# Patient Record
Sex: Female | Born: 1970 | Race: White | Hispanic: No | Marital: Married | Smoking: Former smoker
Health system: Southern US, Community
[De-identification: ages and names within clinical notes are randomized; demographics above are authoritative.]

## PROBLEM LIST (undated history)

## (undated) DIAGNOSIS — K2100 Gastro-esophageal reflux disease with esophagitis, without bleeding: Secondary | ICD-10-CM

## (undated) DIAGNOSIS — Z87448 Personal history of other diseases of urinary system: Secondary | ICD-10-CM

## (undated) DIAGNOSIS — G43909 Migraine, unspecified, not intractable, without status migrainosus: Secondary | ICD-10-CM

## (undated) DIAGNOSIS — L4050 Arthropathic psoriasis, unspecified: Secondary | ICD-10-CM

## (undated) DIAGNOSIS — K449 Diaphragmatic hernia without obstruction or gangrene: Secondary | ICD-10-CM

## (undated) DIAGNOSIS — Z90710 Acquired absence of both cervix and uterus: Secondary | ICD-10-CM

## (undated) DIAGNOSIS — K219 Gastro-esophageal reflux disease without esophagitis: Secondary | ICD-10-CM

## (undated) DIAGNOSIS — M069 Rheumatoid arthritis, unspecified: Secondary | ICD-10-CM

## (undated) HISTORY — DX: Diaphragmatic hernia without obstruction or gangrene: K44.9

## (undated) HISTORY — PX: DILATION AND CURETTAGE OF UTERUS: SHX78

## (undated) HISTORY — DX: Personal history of other diseases of urinary system: Z87.448

## (undated) HISTORY — DX: Migraine, unspecified, not intractable, without status migrainosus: G43.909

## (undated) HISTORY — PX: ERCP: SHX60

## (undated) HISTORY — DX: Arthropathic psoriasis, unspecified: L40.50

## (undated) HISTORY — DX: Acquired absence of both cervix and uterus: Z90.710

## (undated) HISTORY — DX: Gastro-esophageal reflux disease without esophagitis: K21.9

## (undated) HISTORY — DX: Rheumatoid arthritis, unspecified: M06.9

## (undated) HISTORY — DX: Gastro-esophageal reflux disease with esophagitis, without bleeding: K21.00

## (undated) HISTORY — PX: WRIST SURGERY: SHX841

## (undated) HISTORY — PX: TOTAL ABDOMINAL HYSTERECTOMY: SHX209

## (undated) HISTORY — PX: CHOLECYSTECTOMY: SHX55

---

## 2009-11-19 ENCOUNTER — Ambulatory Visit: Payer: Self-pay | Admitting: Family Medicine

## 2016-02-15 ENCOUNTER — Ambulatory Visit (INDEPENDENT_AMBULATORY_CARE_PROVIDER_SITE_OTHER): Payer: 59 | Admitting: Osteopathic Medicine

## 2016-02-15 ENCOUNTER — Encounter: Payer: Self-pay | Admitting: Osteopathic Medicine

## 2016-02-15 VITALS — BP 138/88 | HR 100 | Ht 65.0 in | Wt 188.0 lb

## 2016-02-15 DIAGNOSIS — Z23 Encounter for immunization: Secondary | ICD-10-CM | POA: Insufficient documentation

## 2016-02-15 DIAGNOSIS — E781 Pure hyperglyceridemia: Secondary | ICD-10-CM | POA: Diagnosis not present

## 2016-02-15 DIAGNOSIS — J069 Acute upper respiratory infection, unspecified: Secondary | ICD-10-CM | POA: Diagnosis not present

## 2016-02-15 NOTE — Progress Notes (Signed)
HPI: Summer Myers is a 45 y.o. female who presents to [place] today for chief complaint of:  Chief Complaint  Patient presents with  . Establish Care    need vaccines updated for school.     VARICELLA NEEDED - Was told borderline titers for Varicella for school and needs booster and repeat titers. She doesn't have immunization records with her today.  HX FALL AND WRIST INJURY TO R WRIST - ? Arthritis, (+) ganglion cyst, will consider surgical referral, as previously evaluated by a hand specialist in [place] - patient has been fighting a cold for about a week, slowly getting better, sinus congestion and postnasal drip, controlled with over-the-counter medicines.    Past medical, social and family history reviewed: History reviewed. No pertinent past medical history. Past Surgical History  Procedure Laterality Date  . Dilation and curettage of uterus    . Ercp    . Cholecystectomy     Social History  Substance Use Topics  . Smoking status: Former Research scientist (life sciences)  . Smokeless tobacco: Not on file  . Alcohol Use: Not on file   Family History  Problem Relation Age of Onset  . Diabetes Mother   . Hypertension Mother   . Hypertension Maternal Grandmother   . Alcohol abuse Maternal Grandfather   . Diabetes Paternal Grandmother   . Hypertension Paternal Grandmother   . Cancer Paternal Grandfather   . Hypertension Paternal Grandfather     No current outpatient prescriptions on file.   No current facility-administered medications for this visit.   Allergies  Allergen Reactions  . Amoxicillin     Chest pain and chest tightness  . Cephalexin Rash  . Tetracyclines & Related Rash      Review of Systems: CONSTITUTIONAL:  No  fever, no chills, No  unintentional weight changes HEAD/EYES/EARS/NOSE/THROAT: No  headache, no vision change, no hearing change, No  sore throat, No  sinus pressure CARDIAC: No  chest pain, No  pressure,  No palpitations, No  orthopnea RESPIRATORY: No  cough, No  shortness of breath/wheeze GASTROINTESTINAL: No  nausea, No  vomiting, No  abdominal pain, No  blood in stool, No  diarrhea, No  constipation  MUSCULOSKELETAL: (+) Right wrist myalgia/arthralgia GENITOURINARY: No  incontinence, No  abnormal genital bleeding/discharge SKIN: No  rash/wounds/concerning lesions HEM/ONC: No  easy bruising/bleeding, No  abnormal lymph node ENDOCRINE: No polyuria/polydipsia/polyphagia, No  heat/cold intolerance  NEUROLOGIC: No  weakness, No  dizziness, No  slurred speech PSYCHIATRIC: No  concerns with depression, No  concerns with anxiety, No sleep problems  Exam:  BP 138/88 mmHg  Pulse 100  Ht 5' 5"  (1.651 m)  Wt 188 lb (85.276 kg)  BMI 31.28 kg/m2 Constitutional: VS see above. General Appearance: alert, well-developed, well-nourished, NAD Eyes: Normal lids and conjunctive, non-icteric sclera, PERRLA Ears, Nose, Mouth, Throat: MMM, Normal external inspection ears/nares/mouth/lips/gums, TM normal bilaterally. Pharynx no erythema, no exudate.  Neck: No masses, trachea midline. No thyroid enlargement/tenderness/mass appreciated. No lymphadenopathy Respiratory: Normal respiratory effort. no wheeze, no rhonchi, no rales Cardiovascular: S1/S2 normal, no murmur, no rub/gallop auscultated. RRR.  Gastrointestinal: Nontender, no masses. No hepatomegaly, no splenomegaly.  Musculoskeletal: Gait normal. No clubbing/cyanosis of digits.  Psychiatric: Normal judgment/insight. Normal mood and affect. Oriented x3.    No results found for this or any previous visit (from the past 72 hour(s)).    ASSESSMENT/PLAN:  Need for varicella vaccine - Plan: Varicella zoster antibody, IgG plan to come back when  she is feeling a bit better for varicella vaccine, patient started to call head so she can be put on the nurse schedule for this. She also needs an order for repeat titer testing, this was provided to her along with  repeat lipid panel for hypertriglyceridemia. Await immunization records  Hypertriglyceridemia - Plan: Lipid panel  Viral URI - call a sinus pressure is still no better in the next 5-7 days and can prescribe antibiotic. Otherwise continue supportive care.   Return in about 1 week (around 02/22/2016) for varicella vaccination - nurse visit.

## 2016-02-15 NOTE — Patient Instructions (Signed)
Return as needed and for annual wellness exam.   Please call ahead prior to coming in for your Varicella vaccination so we can put you on the nurse schedule. If you're able, please bring a copy of your immunization records/titers so we can have these on file.   Thanks for coming in today! Take care! -Dr. Loni Muse.

## 2016-02-26 ENCOUNTER — Telehealth: Payer: Self-pay

## 2016-02-26 DIAGNOSIS — K449 Diaphragmatic hernia without obstruction or gangrene: Secondary | ICD-10-CM

## 2016-02-26 NOTE — Telephone Encounter (Signed)
Patient called stated that she has a history of hiatal hernia and she has been seeing a GI specialist here in [place] . She stated that [employer] now requires a referral so she is requesting a referral to a GI here in [place]. Fax [phone]. Chelsea Nusz,CMA

## 2016-02-27 ENCOUNTER — Encounter: Payer: Self-pay | Admitting: Gastroenterology

## 2016-03-14 ENCOUNTER — Telehealth: Payer: Self-pay

## 2016-03-14 ENCOUNTER — Other Ambulatory Visit: Payer: Self-pay | Admitting: Osteopathic Medicine

## 2016-03-14 MED ORDER — ACYCLOVIR 400 MG PO TABS: 800.0000 mg | ORAL_TABLET | Freq: Three times a day (TID) | ORAL | Status: DC

## 2016-03-14 NOTE — Telephone Encounter (Signed)
Patient stated that she only has cold sores every once and a while just around her mouth but no genital outbreaks. Patient is advised that medication has been sent to her pharmacy. Luretta Everly,CMA

## 2016-03-14 NOTE — Telephone Encounter (Signed)
We do not have this medication on file for her. She needs to tell us if she uses the acyclovir for outbreaks as needed or if she uses it every day for suppressive therapy, also if she has genital herpes or just oral cold sores. I need this information so I can order the appropriate dose. Please also ask if she has ever used Valtrex/valacyclovir as this requires less frequent dosing and is typically easier to take. She needs to provide [place] with a list of all prescription and over-the-counter medications that she takes routinely

## 2016-03-14 NOTE — Telephone Encounter (Signed)
Since I'll be out of the office, I went ahead and sent Rx for as-needed cold sore treatment, still need the other info as noted earlier, thanks

## 2016-03-14 NOTE — Telephone Encounter (Signed)
Patient called request a Rx for Acylovir PO. She stated that she gets cold sores and need a rx for this medication. Please advise if patient needs office visit. Rhonda Cunningham,CMA

## 2016-04-16 ENCOUNTER — Ambulatory Visit: Payer: 59 | Admitting: Osteopathic Medicine

## 2016-04-17 ENCOUNTER — Ambulatory Visit: Payer: 59 | Admitting: Osteopathic Medicine

## 2016-04-21 ENCOUNTER — Telehealth: Payer: Self-pay

## 2016-04-21 ENCOUNTER — Ambulatory Visit: Payer: 59 | Admitting: Osteopathic Medicine

## 2016-04-21 NOTE — Telephone Encounter (Addendum)
Patient called and cancelled her appointment for today @ 9:45 for lump in back of her throat. She stated that it hurts when she swallow.. She request a referral for ENT specialist rather than come in for and office visit and waste co pay. Please advise. Jeramy Dimmick,CMA

## 2016-04-21 NOTE — Telephone Encounter (Signed)
Spoke to patient gave her instructions as noted below. Patient stated that she will call her insurance company to see and that if she decided to come in for an office visit she will call the office for an appointment. Nyonna Hargrove,CMA

## 2016-04-21 NOTE — Telephone Encounter (Signed)
I am not sure that a referral will be accepted without an office visit to primary care for further assessment, she should call her insurance company to check up on this. Since there are other things that can cause her symptoms, I cannot diagnose this over the phone but if she would like a referral I'm happy to place one but she needs to let us know if insurance will allow this.

## 2016-04-25 ENCOUNTER — Ambulatory Visit: Payer: 59 | Admitting: Gastroenterology

## 2016-06-13 ENCOUNTER — Ambulatory Visit: Payer: 59 | Admitting: Osteopathic Medicine

## 2016-07-08 ENCOUNTER — Ambulatory Visit: Payer: 59 | Admitting: Osteopathic Medicine

## 2016-07-25 ENCOUNTER — Encounter: Payer: Self-pay | Admitting: Osteopathic Medicine

## 2016-07-25 DIAGNOSIS — R1310 Dysphagia, unspecified: Secondary | ICD-10-CM | POA: Insufficient documentation

## 2016-10-10 DIAGNOSIS — K645 Perianal venous thrombosis: Secondary | ICD-10-CM | POA: Insufficient documentation

## 2016-10-21 DIAGNOSIS — M67439 Ganglion, unspecified wrist: Secondary | ICD-10-CM | POA: Insufficient documentation

## 2017-02-23 ENCOUNTER — Encounter: Payer: Self-pay | Admitting: Osteopathic Medicine

## 2017-02-23 DIAGNOSIS — K649 Unspecified hemorrhoids: Secondary | ICD-10-CM | POA: Insufficient documentation

## 2017-05-05 ENCOUNTER — Other Ambulatory Visit: Payer: Self-pay | Admitting: Osteopathic Medicine

## 2018-04-28 DIAGNOSIS — K449 Diaphragmatic hernia without obstruction or gangrene: Secondary | ICD-10-CM | POA: Insufficient documentation

## 2018-04-28 DIAGNOSIS — I10 Essential (primary) hypertension: Secondary | ICD-10-CM | POA: Insufficient documentation

## 2018-04-28 DIAGNOSIS — K219 Gastro-esophageal reflux disease without esophagitis: Secondary | ICD-10-CM | POA: Insufficient documentation

## 2018-05-03 LAB — HM PAP SMEAR: HM Pap smear: NORMAL

## 2018-11-22 ENCOUNTER — Telehealth: Payer: Self-pay | Admitting: Osteopathic Medicine

## 2018-11-22 NOTE — Telephone Encounter (Signed)
Patient's last visit was 01/2016  Our records indicate that patient is not up to date on annual physical or Pap / Mammogram.   Can we call her to set up an annual wellness physical sometime in the spring, unless she has established with another practice and if so can remove me as PCP?

## 2018-11-22 NOTE — Telephone Encounter (Signed)
Left VM for Pt to return clinic call to schedule.

## 2020-04-06 ENCOUNTER — Encounter: Payer: Self-pay | Admitting: Medical-Surgical

## 2020-04-06 ENCOUNTER — Ambulatory Visit (INDEPENDENT_AMBULATORY_CARE_PROVIDER_SITE_OTHER): Payer: No Typology Code available for payment source | Admitting: Medical-Surgical

## 2020-04-06 ENCOUNTER — Other Ambulatory Visit: Payer: Self-pay

## 2020-04-06 VITALS — BP 133/87 | HR 90 | Ht 64.0 in | Wt 194.0 lb

## 2020-04-06 DIAGNOSIS — R635 Abnormal weight gain: Secondary | ICD-10-CM

## 2020-04-06 DIAGNOSIS — Z0000 Encounter for general adult medical examination without abnormal findings: Secondary | ICD-10-CM | POA: Diagnosis not present

## 2020-04-06 DIAGNOSIS — M2550 Pain in unspecified joint: Secondary | ICD-10-CM

## 2020-04-06 DIAGNOSIS — K449 Diaphragmatic hernia without obstruction or gangrene: Secondary | ICD-10-CM | POA: Diagnosis not present

## 2020-04-06 DIAGNOSIS — G8929 Other chronic pain: Secondary | ICD-10-CM

## 2020-04-06 DIAGNOSIS — Z1211 Encounter for screening for malignant neoplasm of colon: Secondary | ICD-10-CM

## 2020-04-06 MED ORDER — OMEPRAZOLE 40 MG PO CPDR: 40.0000 mg | DELAYED_RELEASE_CAPSULE | Freq: Every day | ORAL | 3 refills | Status: DC

## 2020-04-06 MED ORDER — SAXENDA 18 MG/3ML ~~LOC~~ SOPN: PEN_INJECTOR | SUBCUTANEOUS | 1 refills | Status: DC

## 2020-04-06 MED ORDER — ACYCLOVIR 400 MG PO TABS: ORAL_TABLET | ORAL | 3 refills | Status: DC

## 2020-04-06 NOTE — Progress Notes (Signed)
New Patient Office Visit  Subjective:  Patient ID: Summer Myers, female    DOB: 02-18-1971  Age: 49 y.o. MRN: [id number]  CC:  Chief Complaint  Patient presents with  . Establish Care  . Referral    rheumatology  . Hiatal Hernia    HPI Summer Myers presents to establish care.  Hiatal hernia-history of hiatal hernia and recurrent gastritis.  Has been taking omeprazole 40 mg daily with good control of symptoms.  She stopped taking this medication for a couple of days her symptoms recur.  Currently getting her medication over-the-counter but this is very costly and she would like a prescription today.  Weight gain-has been struggling with weight gain for the last couple of years.  Maintaining around 190 pounds despite regular frequent exercise and dietary modifications.  Previously tried phentermine with weight loss to around 150 pounds.  Regained her weight when her husband was diagnosed with cancer requiring them to relocate for his treatment for 4 months.  Family history of thyroid disease.  Is interested in starting weight loss medications today.  Chronic joint pain-previously tested for rheumatoid arthritis, negative.  Has had chronic joint pain in multiple joints since she was in her 54s.  Family history of rheumatoid arthritis with negative RF.  Requesting referral to rheumatology for further investigation and work-up.  Due for colon cancer screening.  Reports her grandfather died at an early age from colon cancer and that she is overdue.  Would like to do Cologuard.  Due for mammogram and Pap smear.  Will seek her well woman care with gynecology.  Due for Tdap, does not want to update today.  Past Medical History:  Diagnosis Date  . GERD (gastroesophageal reflux disease)   . Rheumatoid arthritis [facility])     Past Surgical History:  Procedure Laterality Date  . CHOLECYSTECTOMY    . DILATION AND CURETTAGE OF UTERUS    . ERCP      Family History  Problem Relation Age  of Onset  . Diabetes Mother   . Hypertension Mother   . Hypertension Maternal Grandmother   . Alcohol abuse Maternal Grandfather   . Diabetes Paternal Grandmother   . Hypertension Paternal Grandmother   . Cancer Paternal Grandfather   . Hypertension Paternal Grandfather     Social History   Socioeconomic History  . Marital status: Married    Spouse name: Not on file  . Number of children: Not on file  . Years of education: Not on file  . Highest education level: Not on file  Occupational History  . Not on file  Tobacco Use  . Smoking status: Former Smoker    Packs/day: 1.00    Types: Cigarettes    Quit date: 04/06/1998    Years since quitting: 22.0  . Smokeless tobacco: Never Used  Substance and Sexual Activity  . Alcohol use: Not Currently    Alcohol/week: 0.0 standard drinks  . Drug use: Never  . Sexual activity: Yes    Partners: Male    Birth control/protection: None  Other Topics Concern  . Not on file  Social History Narrative  . Not on file   Social Determinants of Health   Financial Resource Strain:   . Difficulty of Paying Living Expenses:   Food Insecurity:   . Worried About Charity fundraiser in the Last Year:   . Arboriculturist in the Last Year:   Transportation Needs:   . Film/video editor (Medical):   Marland Kitchen  Lack of Transportation (Non-Medical):   Physical Activity:   . Days of Exercise per Week:   . Minutes of Exercise per Session:   Stress:   . Feeling of Stress :   Social Connections:   . Frequency of Communication with Friends and Family:   . Frequency of Social Gatherings with Friends and Family:   . Attends Religious Services:   . Active Member of Clubs or Organizations:   . Attends Archivist Meetings:   Marland Kitchen Marital Status:   Intimate Partner Violence: Not At Risk  . Fear of Current or Ex-Partner: No  . Emotionally Abused: No  . Physically Abused: No  . Sexually Abused: No    ROS Review of Systems  Constitutional:  Negative for chills, fatigue, fever and unexpected weight change.  HENT: Negative for congestion, rhinorrhea, sinus pressure and sore throat.   Respiratory: Negative for cough, chest tightness and shortness of breath.   Cardiovascular: Negative for chest pain, palpitations and leg swelling.  Gastrointestinal: Negative for abdominal pain, blood in stool, constipation, diarrhea, nausea and vomiting.  Endocrine: Positive for cold intolerance. Negative for heat intolerance.  Genitourinary: Negative for dysuria, frequency, urgency, vaginal bleeding and vaginal discharge.  Skin:       Hair thinning  Neurological: Negative for dizziness, light-headedness and headaches.  Psychiatric/Behavioral: Negative for self-injury, sleep disturbance and suicidal ideas. The patient is not nervous/anxious.     Objective:   Today's Vitals: BP 133/87   Pulse 90   Ht 5' 4"  (1.626 m)   Wt 194 lb (88 kg)   LMP 03/30/2020 (Approximate)   BMI 33.30 kg/m   Physical Exam Vitals reviewed.  Constitutional:      General: She is not in acute distress.    Appearance: Normal appearance.  HENT:     Head: Normocephalic and atraumatic.  Cardiovascular:     Rate and Rhythm: Normal rate and regular rhythm.     Pulses: Normal pulses.     Heart sounds: Normal heart sounds. No murmur. No friction rub. No gallop.   Pulmonary:     Effort: Pulmonary effort is normal. No respiratory distress.     Breath sounds: Normal breath sounds. No wheezing.  Skin:    General: Skin is warm and dry.  Neurological:     Mental Status: She is alert and oriented to person, place, and time.  Psychiatric:        Mood and Affect: Mood normal.        Behavior: Behavior normal.        Thought Content: Thought content normal.        Judgment: Judgment normal.     Assessment & Plan:   1. Hiatal hernia Continue omeprazole 40 mg daily, refill provided.  Given recurrent esophagitis, this will be a maintenance medication for her. -  omeprazole (PRILOSEC) 40 MG capsule; Take 1 capsule (40 mg total) by mouth daily.  Dispense: 90 capsule; Refill: 3  2. Chronic joint pain Refer to rheumatology per patient request. - Ambulatory referral to Rheumatology  3. Weight gain Discussed weight loss options.  Patient very interested in [place].  Sending in [place] prescription in hopes that insurance will cover it.  If this does not go through, we will start phentermine.  Checking TSH. - Liraglutide -Weight Management (SAXENDA) 18 MG/3ML SOPN; Inject 0.1 mLs (0.6 mg total) into the skin once a week for 7 days, THEN 0.2 mLs (1.2 mg total) once a week for 7 days, THEN 0.3 mLs (  1.8 mg total) once a week for 7 days, THEN 0.4 mLs (2.4 mg total) once a week for 7 days, THEN 0.5 mLs (3 mg total) once a week.  Dispense: 7.5 mL; Refill: 1 - TSH  4. Preventative health care Last preventative lab work approximately 1.5 years ago.  Checking CBC, CMP, and lipid panel today. - CBC - COMPLETE METABOLIC PANEL WITH GFR - Lipid panel  5. Colon cancer screening Cologuard ordered. - Cologuard   Outpatient Encounter Medications as of 04/06/2020  Medication Sig  . acyclovir (ZOVIRAX) 400 MG tablet TAKE TWO TABLETS BY MOUTH THREE TIMES DAILY FOR 2-5 DAYS WITH COLD SORE RECURRENCE  . [DISCONTINUED] acyclovir (ZOVIRAX) 400 MG tablet TAKE TWO TABLETS BY MOUTH THREE TIMES DAILY FOR 2-5 DAYS WITH COLD SORE RECURRENCE  . Liraglutide -Weight Management (SAXENDA) 18 MG/3ML SOPN Inject 0.1 mLs (0.6 mg total) into the skin once a week for 7 days, THEN 0.2 mLs (1.2 mg total) once a week for 7 days, THEN 0.3 mLs (1.8 mg total) once a week for 7 days, THEN 0.4 mLs (2.4 mg total) once a week for 7 days, THEN 0.5 mLs (3 mg total) once a week.  Marland Kitchen omeprazole (PRILOSEC) 40 MG capsule Take 1 capsule (40 mg total) by mouth daily.  . [DISCONTINUED] omeprazole (PRILOSEC) 20 MG capsule Take 40 mg by mouth daily.    No facility-administered encounter medications on file as of  04/06/2020.    Follow-up: Return in about 3 months (around 07/06/2020) for weight management follow up.   Clearnce Sorrel, DNP, APRN, FNP-BC [facility]

## 2020-04-09 ENCOUNTER — Other Ambulatory Visit: Payer: Self-pay | Admitting: Medical-Surgical

## 2020-04-09 DIAGNOSIS — R635 Abnormal weight gain: Secondary | ICD-10-CM

## 2020-04-09 MED ORDER — SAXENDA 18 MG/3ML ~~LOC~~ SOPN: 3.0000 mg | PEN_INJECTOR | Freq: Every day | SUBCUTANEOUS | 2 refills | Status: DC

## 2020-04-10 ENCOUNTER — Other Ambulatory Visit: Payer: Self-pay | Admitting: Medical-Surgical

## 2020-04-10 ENCOUNTER — Telehealth: Payer: Self-pay | Admitting: Medical-Surgical

## 2020-04-10 DIAGNOSIS — R635 Abnormal weight gain: Secondary | ICD-10-CM

## 2020-04-10 MED ORDER — SAXENDA 18 MG/3ML ~~LOC~~ SOPN: 3.0000 mg | PEN_INJECTOR | Freq: Every day | SUBCUTANEOUS | 2 refills | Status: DC

## 2020-04-10 NOTE — Telephone Encounter (Signed)
Received fax for PA on Saxenda sent through cover my meds waiting on determination. - CF

## 2020-04-11 ENCOUNTER — Telehealth: Payer: Self-pay

## 2020-04-11 DIAGNOSIS — R635 Abnormal weight gain: Secondary | ICD-10-CM

## 2020-04-11 MED ORDER — TOPIRAMATE 50 MG PO TABS: ORAL_TABLET | ORAL | 0 refills | Status: DC

## 2020-04-11 MED ORDER — PHENTERMINE HCL 8 MG PO TABS: ORAL_TABLET | ORAL | 0 refills | Status: DC

## 2020-04-11 NOTE — Telephone Encounter (Signed)
Saxenda not covered by insurance and is cost prohibitive. Sending in phentermine and topamax individually in hopes that this will be covered and more cost effective. We will need to follow up for weight check in 4 weeks.

## 2020-04-11 NOTE — Telephone Encounter (Signed)
Pt states that her insurance does not cover Saxenda on any tier and will cost her >$1000/month. Pt states she was told if this was the case that she could call back and request phentermine and Topamax.

## 2020-04-12 NOTE — Telephone Encounter (Signed)
Pt aware Rx's have been sent to the pharmacy for her. Pt agreeable to OV in 4 weeks. Call transferred to the front desk for scheduling.

## 2020-04-12 NOTE — Telephone Encounter (Signed)
Received fax form with questions for PA filled out and faxed back to [place]. Waiting on determination. - CF

## 2020-04-13 ENCOUNTER — Ambulatory Visit: Payer: 59 | Admitting: Osteopathic Medicine

## 2020-04-17 NOTE — Telephone Encounter (Signed)
Received fax from [place] and they approved Saxenda for a maximum of 4 fills valid from 04/13/20 - 08/12/20.   PA Reference # [id number]

## 2020-04-30 ENCOUNTER — Other Ambulatory Visit: Payer: Self-pay

## 2020-04-30 ENCOUNTER — Encounter: Payer: Self-pay | Admitting: Rheumatology

## 2020-04-30 ENCOUNTER — Other Ambulatory Visit: Payer: Self-pay | Admitting: Osteopathic Medicine

## 2020-04-30 MED ORDER — ACYCLOVIR 400 MG PO TABS: ORAL_TABLET | ORAL | 3 refills | Status: DC

## 2020-05-10 NOTE — Progress Notes (Signed)
Late cancellation. Erroneous encounter

## 2020-05-11 ENCOUNTER — Encounter: Payer: No Typology Code available for payment source | Admitting: Medical-Surgical

## 2020-05-15 ENCOUNTER — Encounter: Payer: Self-pay | Admitting: Rheumatology

## 2020-05-18 ENCOUNTER — Encounter: Payer: Self-pay | Admitting: Medical-Surgical

## 2020-05-18 ENCOUNTER — Other Ambulatory Visit: Payer: Self-pay | Admitting: Medical-Surgical

## 2020-05-18 DIAGNOSIS — Z1211 Encounter for screening for malignant neoplasm of colon: Secondary | ICD-10-CM

## 2020-05-18 DIAGNOSIS — Z1231 Encounter for screening mammogram for malignant neoplasm of breast: Secondary | ICD-10-CM

## 2020-05-19 LAB — COMPLETE METABOLIC PANEL WITH GFR
AG Ratio: 1.8 (calc) (ref 1.0–2.5)
ALT: 18 U/L (ref 6–29)
AST: 16 U/L (ref 10–35)
Albumin: 4.6 g/dL (ref 3.6–5.1)
Alkaline phosphatase (APISO): 46 U/L (ref 31–125)
BUN: 11 mg/dL (ref 7–25)
CO2: 27 mmol/L (ref 20–32)
Calcium: 9.7 mg/dL (ref 8.6–10.2)
Chloride: 107 mmol/L (ref 98–110)
Creat: 0.87 mg/dL (ref 0.50–1.10)
GFR, Est African American: 93 mL/min/{1.73_m2} (ref >=60)
GFR, Est Non African American: 80 mL/min/{1.73_m2} (ref >=60)
Globulin: 2.5 g/dL (calc) (ref 1.9–3.7)
Glucose, Bld: 88 mg/dL (ref 65–139)
Potassium: 4.1 mmol/L (ref 3.5–5.3)
Sodium: 142 mmol/L (ref 135–146)
Total Bilirubin: 0.4 mg/dL (ref 0.2–1.2)
Total Protein: 7.1 g/dL (ref 6.1–8.1)

## 2020-05-19 LAB — CBC
HCT: 40.9 % (ref 35.0–45.0)
Hemoglobin: 13.3 g/dL (ref 11.7–15.5)
MCH: 29.6 pg (ref 27.0–33.0)
MCHC: 32.5 g/dL (ref 32.0–36.0)
MCV: 90.9 fL (ref 80.0–100.0)
MPV: 11.6 fL (ref 7.5–12.5)
Platelets: 307 10*3/uL (ref 140–400)
RBC: 4.5 10*6/uL (ref 3.80–5.10)
RDW: 12.3 % (ref 11.0–15.0)
WBC: 5.6 10*3/uL (ref 3.8–10.8)

## 2020-05-19 LAB — TSH: TSH: 1.67 mIU/L

## 2020-05-19 LAB — LIPID PANEL
Cholesterol: 206 mg/dL — ABNORMAL HIGH (ref <200)
HDL: 43 mg/dL — ABNORMAL LOW (ref >=50)
LDL Cholesterol (Calc): 141 mg/dL (calc) — ABNORMAL HIGH
Non-HDL Cholesterol (Calc): 163 mg/dL (calc) — ABNORMAL HIGH (ref <130)
Total CHOL/HDL Ratio: 4.8 (calc) (ref <5.0)
Triglycerides: 105 mg/dL (ref <150)

## 2020-05-21 ENCOUNTER — Other Ambulatory Visit (HOSPITAL_BASED_OUTPATIENT_CLINIC_OR_DEPARTMENT_OTHER): Payer: Self-pay | Admitting: Medical-Surgical

## 2020-05-21 DIAGNOSIS — Z1231 Encounter for screening mammogram for malignant neoplasm of breast: Secondary | ICD-10-CM

## 2020-05-25 ENCOUNTER — Encounter (HOSPITAL_BASED_OUTPATIENT_CLINIC_OR_DEPARTMENT_OTHER): Payer: No Typology Code available for payment source

## 2020-06-01 ENCOUNTER — Encounter: Payer: Self-pay | Admitting: Medical-Surgical

## 2020-06-01 ENCOUNTER — Ambulatory Visit (INDEPENDENT_AMBULATORY_CARE_PROVIDER_SITE_OTHER): Payer: No Typology Code available for payment source | Admitting: Medical-Surgical

## 2020-06-01 ENCOUNTER — Other Ambulatory Visit: Payer: Self-pay

## 2020-06-01 DIAGNOSIS — R635 Abnormal weight gain: Secondary | ICD-10-CM

## 2020-06-01 MED ORDER — TOPIRAMATE 50 MG PO TABS: ORAL_TABLET | ORAL | 0 refills | Status: DC

## 2020-06-01 MED ORDER — PHENTERMINE HCL 8 MG PO TABS: ORAL_TABLET | ORAL | 0 refills | Status: DC

## 2020-06-01 NOTE — Progress Notes (Signed)
Subjective:    CC: Weight check  HPI: Pleasant 49 year old female presenting today for weight check on phentermine and Topamax.  Is been taking the medication for approximately 3 weeks but endorses that she did not increase the dose from the half tabs of each.  Reports she is tolerating medications well without side effects.  Notes that since starting Topamax she has had no migraine days.  Has increased her activity and is walking 4-5 times a week at least 30 minutes after work.  Also trying to stay active while at work with and under desk exercise set up.  Recently obtained a [employer] tread climber and is planning to start using that regularly.  Has noticed a decrease in her appetite and cravings.  Trying to make healthier choices in her diet and reports eating lean cuisine meals for lunch at work.  I reviewed the past medical history, family history, social history, surgical history, and allergies today and no changes were needed.  Please see the problem list section below in epic for further details.  Past Medical History: Past Medical History:  Diagnosis Date  . GERD (gastroesophageal reflux disease)   . Rheumatoid arthritis [facility])    Past Surgical History: Past Surgical History:  Procedure Laterality Date  . CHOLECYSTECTOMY    . DILATION AND CURETTAGE OF UTERUS    . ERCP     Social History: Social History   Socioeconomic History  . Marital status: Married    Spouse name: Not on file  . Number of children: Not on file  . Years of education: Not on file  . Highest education level: Not on file  Occupational History  . Not on file  Tobacco Use  . Smoking status: Former Smoker    Packs/day: 1.00    Types: Cigarettes    Quit date: 04/06/1998    Years since quitting: 22.1  . Smokeless tobacco: Never Used  Substance and Sexual Activity  . Alcohol use: Not Currently    Alcohol/week: 0.0 standard drinks  . Drug use: Never  . Sexual activity: Yes    Partners: Male    Birth  control/protection: None  Other Topics Concern  . Not on file  Social History Narrative  . Not on file   Social Determinants of Health   Financial Resource Strain:   . Difficulty of Paying Living Expenses:   Food Insecurity:   . Worried About Charity fundraiser in the Last Year:   . Arboriculturist in the Last Year:   Transportation Needs:   . Film/video editor (Medical):   Marland Kitchen Lack of Transportation (Non-Medical):   Physical Activity:   . Days of Exercise per Week:   . Minutes of Exercise per Session:   Stress:   . Feeling of Stress :   Social Connections:   . Frequency of Communication with Friends and Family:   . Frequency of Social Gatherings with Friends and Family:   . Attends Religious Services:   . Active Member of Clubs or Organizations:   . Attends Archivist Meetings:   Marland Kitchen Marital Status:    Family History: Family History  Problem Relation Age of Onset  . Diabetes Mother   . Hypertension Mother   . Hypertension Maternal Grandmother   . Alcohol abuse Maternal Grandfather   . Diabetes Paternal Grandmother   . Hypertension Paternal Grandmother   . Cancer Paternal Grandfather   . Hypertension Paternal Grandfather    Allergies: Allergies  Allergen Reactions  .  Amoxicillin     Chest pain and chest tightness  . Terfenadine   . Cephalexin Rash  . Tetracyclines & Related Rash   Medications: See med rec.  Review of Systems: No fevers, chills, night sweats, weight loss, chest pain, or shortness of breath.   Objective:    General: Well Developed, well nourished, and in no acute distress.  Neuro: Alert and oriented x3.  HEENT: Normocephalic, atraumatic.  Skin: Warm and dry. Cardiac: Regular rate and rhythm, no murmurs rubs or gallops, no lower extremity edema.  Respiratory: Clear to auscultation bilaterally. Not using accessory muscles, speaking in full sentences.   Impression and Recommendations:    1. Weight gain Only 1 pound weight loss  over the last 3 weeks at half dosing. Continue phentermine and topamax daily but increase to 1 full tab of each daily. Continue increased activity and dietary modifications.  Return in about 4 weeks (around 06/29/2020). ___________________________________________ Clearnce Sorrel, DNP, APRN, FNP-BC [facility] [place]

## 2020-06-07 DIAGNOSIS — B001 Herpesviral vesicular dermatitis: Secondary | ICD-10-CM | POA: Insufficient documentation

## 2020-06-07 NOTE — Progress Notes (Signed)
Virtual Visit via Video Note  I connected with Summer Myers on 06/07/20 at 10:30 AM EDT by a video enabled telemedicine application and verified that I am speaking with the correct person using two identifiers.   I discussed the limitations of evaluation and management by telemedicine and the availability of in person appointments. The patient expressed understanding and agreed to proceed.  Patient location: home Provider locations: office  Subjective:    CC: discuss cold sore suppressive therapy  HPI: Pleasant 49 year old female presenting today via [place] video visit to discuss recurrent oral cold sores and possibility for suppressive therapy.  She has a history of recurrent oral cold sores that usually only bother her approximately 1 time per year but she has had more developed recently.  She is on her third fever blister in the last couple of months.  She reports that they are responsive to antiviral therapy but she is very concerned because her husband has cancer.  He has sarcoidosis and is on CellCept.  Whenever she gets a fever blister they have to make several changes in sleeping arrangements and close contact.  He had a fever blister that was treated with acyclovir but shortly after he ended up with shingles.  They had to treat him for weeks and he had to be taken off his CellCept.  She is very concerned about the possibility that he may have another cold sore due to her own recurrences.  She been under a lot more stress lately and has decided that suppressive therapy would be the smart option.  She has taken acyclovir in the past without difficulty but is open to any of the prophylaxis medications.  Denies fever, chills, recent illnesses.  Past medical history, Surgical history, Family history not pertinant except as noted below, Social history, Allergies, and medications have been entered into the medical record, reviewed, and corrections made.   Review of Systems: See HPI for  pertinent positives and negatives.  Objective:    General: Speaking clearly in complete sentences without any shortness of breath.  Alert and oriented x3.  Normal judgment. No apparent acute distress.  Impression and Recommendations:    1. Recurrent cold sores Discussed prophylaxis options and we will treat with Valtrex 500 mg daily.  Advised patient that if she does develop an active lesion, she should increase Valtrex to 1 g daily until it has resolved.  If she has difficulty tolerating the Valtrex due to the size of the pill, advised patient to contact me we will change to something else.  No follow-ups on file.  20 minutes of non-face-to-face time was provided during this encounter.  I discussed the assessment and treatment plan with the patient. The patient was provided an opportunity to ask questions and all were answered. The patient agreed with the plan and demonstrated an understanding of the instructions.   The patient was advised to call back or seek an in-person evaluation if the symptoms worsen or if the condition fails to improve as anticipated.   Clearnce Sorrel, DNP, APRN, FNP-BC [facility]

## 2020-06-08 ENCOUNTER — Encounter: Payer: Self-pay | Admitting: Medical-Surgical

## 2020-06-08 ENCOUNTER — Telehealth (INDEPENDENT_AMBULATORY_CARE_PROVIDER_SITE_OTHER): Payer: No Typology Code available for payment source | Admitting: Medical-Surgical

## 2020-06-08 VITALS — BP 118/79 | HR 86 | Temp 98.4°F

## 2020-06-08 DIAGNOSIS — B001 Herpesviral vesicular dermatitis: Secondary | ICD-10-CM | POA: Diagnosis not present

## 2020-06-08 MED ORDER — VALACYCLOVIR HCL 500 MG PO TABS: 500.0000 mg | ORAL_TABLET | Freq: Every day | ORAL | 3 refills | Status: DC

## 2020-06-22 LAB — COLOGUARD: Cologuard: NEGATIVE

## 2020-06-26 LAB — COLOGUARD

## 2020-06-27 ENCOUNTER — Encounter: Payer: Self-pay | Admitting: Medical-Surgical

## 2020-07-06 ENCOUNTER — Encounter: Payer: Self-pay | Admitting: Medical-Surgical

## 2020-07-06 ENCOUNTER — Ambulatory Visit (INDEPENDENT_AMBULATORY_CARE_PROVIDER_SITE_OTHER): Payer: No Typology Code available for payment source | Admitting: Medical-Surgical

## 2020-07-06 ENCOUNTER — Other Ambulatory Visit: Payer: Self-pay

## 2020-07-06 ENCOUNTER — Ambulatory Visit: Payer: No Typology Code available for payment source | Admitting: Medical-Surgical

## 2020-07-06 VITALS — BP 129/80 | HR 92 | Temp 97.9°F | Ht 64.0 in | Wt 191.4 lb

## 2020-07-06 DIAGNOSIS — Z6832 Body mass index (BMI) 32.0-32.9, adult: Secondary | ICD-10-CM

## 2020-07-06 DIAGNOSIS — L989 Disorder of the skin and subcutaneous tissue, unspecified: Secondary | ICD-10-CM

## 2020-07-06 DIAGNOSIS — G43019 Migraine without aura, intractable, without status migrainosus: Secondary | ICD-10-CM | POA: Diagnosis not present

## 2020-07-06 MED ORDER — BUTALBITAL-APAP-CAFFEINE 50-325-40 MG PO TABS: 1.0000 | ORAL_TABLET | Freq: Two times a day (BID) | ORAL | 0 refills | Status: AC | PRN

## 2020-07-06 MED ORDER — PHENTERMINE HCL 8 MG PO TABS: ORAL_TABLET | ORAL | 0 refills | Status: DC

## 2020-07-06 MED ORDER — TOPIRAMATE 50 MG PO TABS: ORAL_TABLET | ORAL | 0 refills | Status: DC

## 2020-07-06 NOTE — Progress Notes (Signed)
Subjective:    CC: weight check, migraine, mole  HPI: Summer 49 year old Myers presenting today for weight check. She would also like to discuss migraine and a mole.  Weight check-taking phentermine with Topamax daily. Has increased to the full 8 mg tablet phentermine but for the last 4 days has only taken a half tab due to her headache. She has been working to focus on better dietary choices but notes that they are renovating their home and tend to get takeout in the evenings. She is walking 4 to 6 days/week around the block for 2-5 loose. Notes that 5 loops around the block is approximately 5500 steps. She is not keeping a food diary but does tend to eat the same things regularly. For breakfast she sometimes skips that she does not usually eat breakfast but she does have coffee with light cream and the occasional Premier protein shake. Sometimes she also snacks on nuts if she does not do the shake. For lunch she usually eats only cuisine and a few apple slices. She does have about one half of a can of Diet Coke daily for dinner she reports eating usually happens whenever they get for take out. Only drinking 216 ounce bottles of water per day. Notes that her appetite is fairly well suppressed and she does not usually feel hungry. At the 8 mg dose of phentermine she notes that she was more jittery but denies chest pain, shortness of breath, palpitations, GI symptoms. No interruption in sleeping.  Migraine-history of migraines. Taking Topamax 50 mg daily which she reports helps tremendously with prophylaxis. Unfortunately she does tend to experience migraines triggered by red dye. Over the weekend she had a sonic cherry wine made and since has had a migraine accompanied by nausea, photophobia, and phonophobia. She has been taking ibuprofen and Tylenol regularly for the last 5 days without resolution. She also notes that her husband has prednisone on hand so she has taken 40 mg daily x2 and plan to take  another 40 mg today with 20 mg tomorrow. Headache has improved some but has not resolved. She took Fioricet in the past with good relief and would like to have a prescription to keep on hand as needed for migraines.  Skin lesion-has a small flesh toned mole on her left upper eyelid near the bra line. Would like to have this removed. Requesting a referral to dermatology as this is a prominent place with the need to avoid scarring.  I reviewed the past medical history, family history, social history, surgical history, and allergies today and no changes were needed.  Please see the problem list section below in epic for further details.  Past Medical History: Past Medical History:  Diagnosis Date  . GERD (gastroesophageal reflux disease)   . Rheumatoid arthritis [facility])    Past Surgical History: Past Surgical History:  Procedure Laterality Date  . CHOLECYSTECTOMY    . DILATION AND CURETTAGE OF UTERUS    . ERCP     Social History: Social History   Socioeconomic History  . Marital status: Married    Spouse name: Not on file  . Number of children: Not on file  . Years of education: Not on file  . Highest education level: Not on file  Occupational History  . Not on file  Tobacco Use  . Smoking status: Former Smoker    Packs/day: 1.00    Types: Cigarettes    Quit date: 04/06/1998    Years since quitting: 22.2  .  Smokeless tobacco: Never Used  Substance and Sexual Activity  . Alcohol use: Not Currently    Alcohol/week: 0.0 standard drinks  . Drug use: Never  . Sexual activity: Yes    Partners: Male    Birth control/protection: None  Other Topics Concern  . Not on file  Social History Narrative  . Not on file   Social Determinants of Health   Financial Resource Strain:   . Difficulty of Paying Living Expenses:   Food Insecurity:   . Worried About Charity fundraiser in the Last Year:   . Arboriculturist in the Last Year:   Transportation Needs:   . Film/video editor  (Medical):   Marland Kitchen Lack of Transportation (Non-Medical):   Physical Activity:   . Days of Exercise per Week:   . Minutes of Exercise per Session:   Stress:   . Feeling of Stress :   Social Connections:   . Frequency of Communication with Friends and Family:   . Frequency of Social Gatherings with Friends and Family:   . Attends Religious Services:   . Active Member of Clubs or Organizations:   . Attends Archivist Meetings:   Marland Kitchen Marital Status:    Family History: Family History  Problem Relation Age of Onset  . Diabetes Mother   . Hypertension Mother   . Hypertension Maternal Grandmother   . Alcohol abuse Maternal Grandfather   . Diabetes Paternal Grandmother   . Hypertension Paternal Grandmother   . Cancer Paternal Grandfather   . Hypertension Paternal Grandfather    Allergies: Allergies  Allergen Reactions  . Amoxicillin     Chest pain and chest tightness  . Terfenadine   . Cephalexin Rash  . Tetracyclines & Related Rash   Medications: See med rec.  Review of Systems: See HPI for pertinent positives and negatives.   Objective:    General: Well Developed, well nourished, and in no acute distress.  Neuro: Alert and oriented x3. Sensation grossly intact. HEENT: Normocephalic, atraumatic.  Skin: Warm and dry. Flesh-colored 0.25 cm round moles noted to the left upper thigh near the bra line, no erythema. Cardiac: Regular rate and rhythm, no murmurs rubs or gallops, no lower extremity edema.  Respiratory: Clear to auscultation bilaterally. Not using accessory muscles, speaking in full sentences.  Impression and Recommendations:    1. Intractable migraine without aura and without status migrainosus Continue Topamax 50 mg daily. Fioricet 1 tab twice daily as needed for migraine. Discussed avoiding known triggers. - butalbital-acetaminophen-caffeine (FIORICET) 50-325-40 MG tablet; Take 1 tablet by mouth 2 (two) times daily as needed for up to 10 days for headache or  migraine.  Dispense: 20 tablet; Refill: 0  2. BMI 32.0-32.9,adult Approximately 2 pound weight loss over the last 4 weeks. Continue Topamax 50 mg daily and phentermine 8 mg daily. If 8 mg is causing jitteriness, okay to take one half tab as this seems to have a similar appetite suppression compared to whole tab. May need to to consider changing to a different weight loss medication if no significant results after this month. Discussed calorie restriction and maintaining a food diary. Recommend adding strength exercises and increasing protein and water intake. - topiramate (TOPAMAX) 50 MG tablet; Take 1 tablet (25m) daily.  Dispense: 30 tablet; Refill: 0 - Phentermine HCl 8 MG TABS; Take 1 tab (846m daily.  Dispense: 30 tablet; Refill: 0  3. Skin lesion As this lesion is a very prominent place and there is  a desire to prevent scarring, referring to dermatology for removal. - Ambulatory referral to Dermatology  Return in about 4 weeks (around 08/03/2020) for weight check. ___________________________________________ Clearnce Sorrel, DNP, APRN, FNP-BC [facility]

## 2020-07-12 ENCOUNTER — Telehealth: Payer: Self-pay | Admitting: Physician Assistant

## 2020-07-12 NOTE — Telephone Encounter (Signed)
Patient is calling for a referral appointment from Samuel Bouche, NP for moles to be checked.  Patient is scheduled for 01/04/2021 at 9:00 with Robyne Askew, PAC.

## 2020-07-13 NOTE — Progress Notes (Deleted)
Office Visit Note  Patient: Summer Myers             Date of Birth: 1971/05/27           MRN: [id number]             PCP: Samuel Bouche, NP Referring: Samuel Bouche, NP Visit Date: 07/27/2020 Occupation: @GUAROCC @  Subjective:  No chief complaint on file.   History of Present Illness: Summer Myers is a 49 y.o. female ***   Activities of Daily Living:  Patient reports morning stiffness for *** {minute/hour:19697}.   Patient {ACTIONS;DENIES/REPORTS:21021675::"Denies"} nocturnal pain.  Difficulty dressing/grooming: {ACTIONS;DENIES/REPORTS:21021675::"Denies"} Difficulty climbing stairs: {ACTIONS;DENIES/REPORTS:21021675::"Denies"} Difficulty getting out of chair: {ACTIONS;DENIES/REPORTS:21021675::"Denies"} Difficulty using hands for taps, buttons, cutlery, and/or writing: {ACTIONS;DENIES/REPORTS:21021675::"Denies"}  No Rheumatology ROS completed.   PMFS History:  Patient Active Problem List   Diagnosis Date Noted  . BMI 32.0-32.9,adult 07/06/2020  . Intractable migraine without aura and without status migrainosus 07/06/2020  . Skin lesion 07/06/2020  . 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 Essential hypertension 04/28/2018  . Gastroesophageal reflux disease 04/28/2018  . Hemorrhoid 02/23/2017  . Ganglion of wrist 10/21/2016  . Thrombosed external hemorrhoid 10/10/2016  . Dysphagia 07/25/2016  . Need for varicella vaccine 02/15/2016  . Hypertriglyceridemia 02/15/2016    Past Medical History:  Diagnosis Date  . GERD (gastroesophageal reflux disease)   . Rheumatoid arthritis ([place])     Family History  Problem Relation Age of Onset  . Diabetes Mother   . Hypertension Mother   . Hypertension Maternal Grandmother   . Alcohol abuse Maternal Grandfather   . Diabetes Paternal Grandmother   . Hypertension Paternal Grandmother   . Cancer Paternal Grandfather   . Hypertension Paternal Grandfather    Past Surgical History:  Procedure Laterality Date  . CHOLECYSTECTOMY    .  DILATION AND CURETTAGE OF UTERUS    . ERCP     Social History   Social History Narrative  . Not on file   Immunization History  Administered Date(s) Administered  . Influenza, Quadrivalent, Recombinant, Inj, Pf 11/09/2017  . Influenza,inj,quad, With Preservative 10/15/2017  . Influenza-Unspecified 12/16/2012  . PFIZER SARS-COV-2 Vaccination 01/11/2020, 02/01/2020  . PPD Test 08/12/2011  . Tdap 12/29/2009     Objective: Vital Signs: There were no vitals taken for this visit.   Physical Exam   Musculoskeletal Exam: ***  CDAI Exam: CDAI Score: -- Patient Global: --; Provider Global: -- Swollen: --; Tender: -- Joint Exam 07/27/2020   No joint exam has been documented for this visit   There is currently no information documented on the homunculus. Go to the Rheumatology activity and complete the homunculus joint exam.  Investigation: No additional findings.  Imaging: No results found.  Recent Labs: Lab Results  Component Value Date   WBC 5.6 05/18/2020   HGB 13.3 05/18/2020   PLT 307 05/18/2020   NA 142 05/18/2020   K 4.1 05/18/2020   CL 107 05/18/2020   CO2 27 05/18/2020   GLUCOSE 88 05/18/2020   BUN 11 05/18/2020   CREATININE 0.87 05/18/2020   BILITOT 0.4 05/18/2020   AST 16 05/18/2020   ALT 18 05/18/2020   PROT 7.1 05/18/2020   CALCIUM 9.7 05/18/2020   GFRAA 93 05/18/2020    Speciality Comments: No specialty comments available.  Procedures:  No procedures performed Allergies: Amoxicillin, Terfenadine, Cephalexin, and Tetracyclines & related   Assessment / Plan:     Visit Diagnoses: No diagnosis found.  Orders: No orders of the  defined types were placed in this encounter.  No orders of the defined types were placed in this encounter.   Face-to-face time spent with patient was *** minutes. Greater than 50% of time was spent in counseling and coordination of care.  Follow-Up Instructions: No follow-ups on file.   Ofilia Neas, PA-C  Note  - This record has been created using Dragon software.  Chart creation errors have been sought, but may not always  have been located. Such creation errors do not reflect on  the standard of medical care.

## 2020-07-17 ENCOUNTER — Other Ambulatory Visit (HOSPITAL_BASED_OUTPATIENT_CLINIC_OR_DEPARTMENT_OTHER): Payer: Self-pay | Admitting: Medical-Surgical

## 2020-07-17 DIAGNOSIS — Z1231 Encounter for screening mammogram for malignant neoplasm of breast: Secondary | ICD-10-CM

## 2020-07-20 ENCOUNTER — Ambulatory Visit (HOSPITAL_BASED_OUTPATIENT_CLINIC_OR_DEPARTMENT_OTHER)
Admission: RE | Admit: 2020-07-20 | Discharge: 2020-07-20 | Disposition: A | Discharge status: Home / Self Care | Payer: No Typology Code available for payment source | Source: Ambulatory Visit | Attending: Medical-Surgical | Admitting: Medical-Surgical

## 2020-07-20 ENCOUNTER — Other Ambulatory Visit: Payer: Self-pay

## 2020-07-20 DIAGNOSIS — Z1231 Encounter for screening mammogram for malignant neoplasm of breast: Secondary | ICD-10-CM | POA: Insufficient documentation

## 2020-07-27 ENCOUNTER — Ambulatory Visit: Payer: No Typology Code available for payment source

## 2020-07-27 ENCOUNTER — Ambulatory Visit: Payer: No Typology Code available for payment source | Admitting: Rheumatology

## 2020-07-30 ENCOUNTER — Other Ambulatory Visit: Payer: Self-pay

## 2020-07-30 ENCOUNTER — Ambulatory Visit (INDEPENDENT_AMBULATORY_CARE_PROVIDER_SITE_OTHER): Payer: No Typology Code available for payment source | Admitting: Obstetrics & Gynecology

## 2020-07-30 ENCOUNTER — Encounter: Payer: Self-pay | Admitting: Obstetrics & Gynecology

## 2020-07-30 VITALS — BP 132/91 | HR 103 | Resp 16 | Ht 65.0 in | Wt 189.0 lb

## 2020-07-30 DIAGNOSIS — N920 Excessive and frequent menstruation with regular cycle: Secondary | ICD-10-CM

## 2020-07-30 DIAGNOSIS — N938 Other specified abnormal uterine and vaginal bleeding: Secondary | ICD-10-CM

## 2020-07-30 DIAGNOSIS — Z01812 Encounter for preprocedural laboratory examination: Secondary | ICD-10-CM | POA: Diagnosis not present

## 2020-07-30 DIAGNOSIS — Z30430 Encounter for insertion of intrauterine contraceptive device: Secondary | ICD-10-CM | POA: Diagnosis not present

## 2020-07-30 LAB — POCT URINE PREGNANCY: Preg Test, Ur: NEGATIVE

## 2020-07-30 MED ORDER — LEVONORGESTREL 20 MCG/24HR IU IUD: INTRAUTERINE_SYSTEM | Freq: Once | INTRAUTERINE | Status: AC

## 2020-07-30 NOTE — Progress Notes (Signed)
Subjective:    Patient ID: Summer Myers, female    DOB: 1971-08-23, 49 y.o.   MRN: [id number]  HPI  Pt presents for long history of menorrhagia with regular cycle.  Pt was going to get an ablation and sling back in 2014.  Her husband was diagnosed with testicular cancer and these plans were on hold.  She saw a [facility] MD who tried her on Lysteda in 2014 which did not help  She can't take hormonal birth control due to migraine with aura.  She has not considered a Mirena but is open    Review of Systems  Constitutional: Negative.   Respiratory: Negative.   Cardiovascular: Negative.   Gastrointestinal: Negative.   Genitourinary: Positive for menstrual problem and vaginal bleeding.  Psychiatric/Behavioral: Negative.        Objective:   Physical Exam Vitals reviewed.  Constitutional:      General: She is not in acute distress.    Appearance: She is well-developed.  HENT:     Head: Normocephalic and atraumatic.  Eyes:     Conjunctiva/sclera: Conjunctivae normal.  Cardiovascular:     Rate and Rhythm: Normal rate.  Pulmonary:     Effort: Pulmonary effort is normal.  Abdominal:     General: There is no distension.     Palpations: Abdomen is soft.     Tenderness: There is no abdominal tenderness.  Genitourinary:    Comments: Tanner V Vulva:  No lesion Vagina:  Pink, no lesions, no discharge, no blood Cervix:  No CMT Uterus:  Non tender, mobile, slightly enlarged Right adnexa--non tender, no mass Left adnexa--non tender, no mass  Skin:    General: Skin is warm and dry.  Neurological:     Mental Status: She is alert and oriented to person, place, and time.    ENDOMETRIAL BIOPSY     The indications for endometrial biopsy were reviewed.   Risks of the biopsy including cramping, bleeding, infection, uterine perforation, inadequate specimen and need for additional procedures  were discussed. The patient states she understands and agrees to undergo procedure today. Consent was  signed. Time out was performed. Urine HCG was negative. A sterile speculum was placed in the patient's vagina and the cervix was prepped with Betadine. A single-toothed tenaculum was placed on the anterior lip of the cervix to stabilize it. The 3 mm pipelle was introduced into the endometrial cavity without difficulty to a depth of 8 cm, and a moderate amount of tissue was obtained and sent to pathology. The instruments were removed from the patient's vagina. Minimal bleeding from the cervix was noted. The patient tolerated the procedure well. Routine post-procedure instructions were given to the patient.    IUD Procedure Note Patient identified, informed consent performed.  Discussed risks of irregular bleeding, cramping, infection, malpositioning or misplacement of the IUD outside the uterus which may require further procedures. Time out was performed.  Urine pregnancy test negative.  Speculum placed in the vagina.  Cervix visualized.  Cleaned with Betadine x 2.  Grasped anteriorly with a single tooth tenaculum.  Uterus sounded to 8.5 cm.  Mirena IUD placed per manufacturer's recommendations.  Strings trimmed to 3 cm. Tenaculum was removed, good hemostasis noted.  Patient tolerated procedure well.   Patient was given post-procedure instructions and the Mirena care card with expiration date.  Patient was also asked to check IUD strings periodically and follow up in 4-6 weeks for IUD check.     Assessment & Plan:  49  yo female presents for menorrhagia with regular cycle.  1. TVUS 2.  Endometrial biopsy 3.  Counseling about meds,IUD, ablation, hysterectomy.  Pt decided on IUD and Mirena was placed.    45 minutes spent with patient on history, review of records, counseling, physical exam and documentation.  Biopsy and IUD placement took addition al 15 minutes.

## 2020-07-31 ENCOUNTER — Other Ambulatory Visit: Payer: Self-pay | Admitting: Obstetrics & Gynecology

## 2020-08-03 ENCOUNTER — Ambulatory Visit: Payer: No Typology Code available for payment source | Admitting: Medical-Surgical

## 2020-08-10 ENCOUNTER — Ambulatory Visit (INDEPENDENT_AMBULATORY_CARE_PROVIDER_SITE_OTHER): Payer: No Typology Code available for payment source | Admitting: Medical-Surgical

## 2020-08-10 ENCOUNTER — Encounter: Payer: Self-pay | Admitting: Medical-Surgical

## 2020-08-10 DIAGNOSIS — Z6831 Body mass index (BMI) 31.0-31.9, adult: Secondary | ICD-10-CM

## 2020-08-10 MED ORDER — TOPIRAMATE 50 MG PO TABS: ORAL_TABLET | ORAL | 1 refills | Status: DC

## 2020-08-10 NOTE — Progress Notes (Signed)
Subjective:    CC: weight check  HPI: Pleasant 49 year old female presenting today for weight check on phentermine and Topamax.  Reports she has continued to take half tab phentermine as she is afraid of increasing to a full tab and developing side effects.  She is taking Topamax as prescribed, tolerating well without side effects.  Reports Topamax has helped to reduce her headaches and would like to continue that medicine even after reaching her weight loss goals.  Reports that she has been very busy recently and is having difficulty finding time to eat enough food.  Does try to get enough protein and has been supplementing her intake with protein shakes.  Continues exercising walking 2-5 laps around the neighborhood. Has recently noticed a little hair loss and thinks it is related to not eating enough.  Denies chest pain, palpitations, headaches, and dizziness.  Will be leaving her job in September and moving to a 100% remote job after that. 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  I reviewed the past medical history, family history, social history, surgical history, and allergies today and no changes were needed.  Please see the problem list section below in epic for further details.  Past Medical History: Past Medical History:  Diagnosis Date  . GERD (gastroesophageal reflux disease)   . History of prolapse of bladder   . Rheumatoid arthritis [facility])    Past Surgical History: Past Surgical History:  Procedure Laterality Date  . CHOLECYSTECTOMY    . DILATION AND CURETTAGE OF UTERUS    . ERCP     Social History: Social History   Socioeconomic History  . Marital status: Married    Spouse name: Not on file  . Number of children: Not on file  . Years of education: Not on file  . Highest education level: Not on file  Occupational History  . Not on file  Tobacco Use  . Smoking status: Former  Smoker    Packs/day: 1.00    Types: Cigarettes    Quit date: 04/06/1998    Years since quitting: 22.3  . Smokeless tobacco: Never Used  Substance and Sexual Activity  . Alcohol use: Not Currently    Alcohol/week: 0.0 standard drinks  . Drug use: Never  . Sexual activity: Yes    Partners: Male    Birth control/protection: None    Comment: vasectomy  Other Topics Concern  . Not on file  Social History Narrative  . Not on file   Social Determinants of Health   Financial Resource Strain:   . Difficulty of Paying Living Expenses:   Food Insecurity:   . Worried About Charity fundraiser in the Last Year:   . Arboriculturist in the Last Year:   Transportation Needs:   . Film/video editor (Medical):   Marland Kitchen Lack of Transportation (Non-Medical):   Physical Activity:   . Days of Exercise per Week:   . Minutes of Exercise per Session:   Stress:   . Feeling of Stress :   Social Connections:   . Frequency of Communication with Friends and Family:   . Frequency of Social Gatherings with Friends and Family:   . Attends Religious Services:   . Active Member of Clubs or Organizations:   . Attends Archivist Meetings:   Marland Kitchen Marital Status:    Family History: Family History  Problem Relation  Age of Onset  . Diabetes Mother   . Hypertension Mother   . Hypertension Maternal Grandmother   . Alcohol abuse Maternal Grandfather   . Diabetes Paternal Grandmother   . Hypertension Paternal Grandmother   . Cancer Paternal Grandfather   . Hypertension Paternal Grandfather   . Breast cancer Sister 110       Bilateral   Allergies: Allergies  Allergen Reactions  . Amoxicillin     Chest pain and chest tightness  . Terfenadine   . Cephalexin Rash  . Tetracyclines & Related Rash   Medications: See med rec.  Review of Systems: See HPI for pertinent positives and negatives.   Objective:    General: Well Developed, well nourished, and in no acute distress.  Neuro: Alert and  oriented x3.  HEENT: Normocephalic, atraumatic.  Skin: Warm and dry. Cardiac: Regular rate and rhythm, no murmurs rubs or gallops, no lower extremity edema.  Respiratory: Clear to auscultation bilaterally. Not using accessory muscles, speaking in full sentences.  Impression and Recommendations:    1. BMI 31.0-31.9,adult 3.5lb weight loss in the last 4 weeks. Continue phentermine 1/2 tab and topamax 76m daily. Continue regular exercise. Work to eat a more varied diet consisting of all food groups. Will plan to increase to 1 full tab of phentermine in September. - topiramate (TOPAMAX) 50 MG tablet; Take 1 tablet (556m daily.  Dispense: 90 tablet; Refill: 1  Return in about 4 weeks (around 09/07/2020) for weight check. ___________________________________________ JoClearnce SorrelDNP, APRN, FNP-BC [facility]

## 2020-08-16 ENCOUNTER — Encounter: Payer: Self-pay | Admitting: Medical-Surgical

## 2020-08-16 ENCOUNTER — Telehealth (INDEPENDENT_AMBULATORY_CARE_PROVIDER_SITE_OTHER): Payer: No Typology Code available for payment source | Admitting: Medical-Surgical

## 2020-08-16 ENCOUNTER — Telehealth: Payer: Self-pay

## 2020-08-16 DIAGNOSIS — M2550 Pain in unspecified joint: Secondary | ICD-10-CM | POA: Diagnosis not present

## 2020-08-16 DIAGNOSIS — G8929 Other chronic pain: Secondary | ICD-10-CM | POA: Diagnosis not present

## 2020-08-16 DIAGNOSIS — E781 Pure hyperglyceridemia: Secondary | ICD-10-CM

## 2020-08-16 MED ORDER — PREDNISONE 20 MG PO TABS
ORAL_TABLET | ORAL | 0 refills | Status: DC
Start: 2020-08-16 — End: 2021-05-16

## 2020-08-16 NOTE — Telephone Encounter (Signed)
-----   Message from Samuel Bouche, NP sent at 08/16/2020  2:38 PM EDT ----- Regarding: Please contact patient Please call patient. If she has availability, see if we can do a quick virtual visit to discuss her symptoms this afternoon. A 4:20pm slot can be made if needed.  Thanks, Caryl Asp

## 2020-08-16 NOTE — Progress Notes (Signed)
Virtual Visit via Video Note  I connected with [name] on 08/16/20 at  4:20 PM EDT by a video enabled telemedicine application and verified that I am speaking with the correct person using two identifiers.   I discussed the limitations of evaluation and management by telemedicine and the availability of in person appointments. The patient expressed understanding and agreed to proceed.  Patient location: home Provider locations: office  Subjective:    CC: RA flare  HPI: Pleasant 49 year old female presenting via [place] video visit to discuss rheumatoid flare.  She reports being diagnosed with rheumatoid arthritis several years ago after she had x-rays showing significant joint inflammation.  Since then she has had repeated flares involving multiple joints including bilateral shoulders, hips, knees, ankles, and most of the joints in her hands.  She is also started to have noticeable joint changes in her knuckles and fingers.  Is no longer able to wear her rings.  Notes that she is having flares more frequently now, some occurring once a month.  She does have a positive family history of rheumatoid arthritis.  Currently experiencing a flare that started 2-3 days ago.  This flare is affecting both hands, elbows, feet, and ankles.  Describes the pain as burning with warmth and tenderness at the joints.  She has been taking 800 mg of ibuprofen every 8 hours and using Voltaren cream gel topically.  Notes that her husband has prednisone for chronic use and she has taken 40 mg yesterday and today.  When she has flares, she usually takes 40 mg of prednisone daily x3 followed by 2 days of 20 mg daily.  She discussed with her husband's rheumatologist recently about the frequency of her flares and was advised that starting prednisone 5 mg daily would likely be the best option.  We did do a referral to rheumatology but she has been unable to connect with them yet as they are booked out until January 2022.    Past medical history, Surgical history, Family history not pertinant except as noted below, Social history, Allergies, and medications have been entered into the medical record, reviewed, and corrections made.   Review of Systems: See HPI for pertinent positives and negatives.   Objective:    General: Speaking clearly in complete sentences without any shortness of breath.  Alert and oriented x3.  Normal judgment. No apparent acute distress.  Impression and Recommendations:    1. Chronic joint pain No official diagnosis of RA in the records we have but chronic pain of joints with flares and joint changes fit the description. Discussed prednisone use and the inherent risks. She is taking Vitamin D3, Calcium, and MVI daily. Also eating Greek yogurt to boost calcium intake. We will go ahead and treat her flare with another day of 89m prednisone followed by 2 days of 236mdaily. MyChart message sent to patient to inquire about previous work up. If no previous workup or imaging available we will need to draw labs and get x-rays. She will need to be off Prednisone for at least 2 weeks for labs to be accurate.  Return if symptoms worsen or fail to improve.  20 minutes of non-face-to-face time was provided during this encounter.  I discussed the assessment and treatment plan with the patient. The patient was provided an opportunity to ask questions and all were answered. The patient agreed with the plan and demonstrated an understanding of the instructions.   The patient was advised to call back or seek  an in-person evaluation if the symptoms worsen or if the condition fails to improve as anticipated.   Clearnce Sorrel, DNP, APRN, FNP-BC [facility]

## 2020-08-16 NOTE — Telephone Encounter (Signed)
Called and spoke with pt who was agreeable to scheduling a virtual visit with Joy this afternoon at 4:20 PM.

## 2020-08-17 ENCOUNTER — Telehealth: Payer: Self-pay

## 2020-08-17 ENCOUNTER — Ambulatory Visit: Payer: No Typology Code available for payment source | Admitting: Rheumatology

## 2020-08-17 ENCOUNTER — Other Ambulatory Visit: Payer: No Typology Code available for payment source

## 2020-08-17 NOTE — Telephone Encounter (Signed)
-----   Message from Samuel Bouche, NP sent at 08/17/2020 10:19 AM EDT ----- Regarding: contact patient Can you please call patient to see if she got my voice mail from yesterday afternoon? I sent a MyChart message as well but it has not been read.

## 2020-08-17 NOTE — Telephone Encounter (Signed)
[place] (1st attempt)    Please see Joy's message below

## 2020-08-20 NOTE — Telephone Encounter (Signed)
Called and spoke with pt who states that she did get the voicemail. She said to let Joy know that she is working on getting the medical records and that with the change in jobs she will be working on getting scheduled with a Rheumatologist but it may take a month or so do to it.

## 2020-08-31 ENCOUNTER — Ambulatory Visit (INDEPENDENT_AMBULATORY_CARE_PROVIDER_SITE_OTHER): Payer: No Typology Code available for payment source

## 2020-08-31 ENCOUNTER — Other Ambulatory Visit: Payer: Self-pay

## 2020-08-31 ENCOUNTER — Ambulatory Visit (INDEPENDENT_AMBULATORY_CARE_PROVIDER_SITE_OTHER): Payer: No Typology Code available for payment source | Admitting: Family Medicine

## 2020-08-31 ENCOUNTER — Encounter: Payer: Self-pay | Admitting: Family Medicine

## 2020-08-31 VITALS — BP 136/83 | HR 94 | Resp 16 | Ht 65.0 in | Wt 189.0 lb

## 2020-08-31 DIAGNOSIS — N920 Excessive and frequent menstruation with regular cycle: Secondary | ICD-10-CM | POA: Diagnosis not present

## 2020-08-31 DIAGNOSIS — N938 Other specified abnormal uterine and vaginal bleeding: Secondary | ICD-10-CM

## 2020-08-31 NOTE — Progress Notes (Signed)
   Subjective:    Patient ID: Summer Myers is a 49 y.o. female presenting with string check  on 08/31/2020  HPI: Here for IUD string check. Placed 07/30/2020 for bleeding. Has had spotting and lighter cycle.   Review of Systems  Constitutional: Negative for chills and fever.  Respiratory: Negative for shortness of breath.   Cardiovascular: Negative for chest pain.  Gastrointestinal: Negative for abdominal pain, nausea and vomiting.  Genitourinary: Negative for dysuria.  Skin: Negative for rash.      Objective:    BP 136/83   Pulse 94   Resp 16   Ht 5' 5"  (1.651 m)   Wt 189 lb (85.7 kg)   BMI 31.45 kg/m  Physical Exam Constitutional:      General: She is not in acute distress.    Appearance: She is well-developed.  HENT:     Head: Normocephalic and atraumatic.  Eyes:     General: No scleral icterus. Cardiovascular:     Rate and Rhythm: Normal rate.  Pulmonary:     Effort: Pulmonary effort is normal.  Abdominal:     Palpations: Abdomen is soft.  Genitourinary:    Comments: NEFG, IUD strings noted. Musculoskeletal:     Cervical back: Neck supple.  Skin:    General: Skin is warm and dry.  Neurological:     Mental Status: She is alert and oriented to person, place, and time.         Assessment & Plan:   Problem List Items Addressed This Visit      Unprioritized   Menorrhagia with regular cycle - Primary    Good response to IUD, in place. Has u/s today.         Total time in review of prior notes, pathology, labs, history taking, review with patient, exam, note writing, discussion of options, plan for next steps, alternatives and risks of treatment: 12 minutes.  Return in about 3 months (around 11/30/2020) for a follow-up.  Donnamae Jude 08/31/2020 11:09 AM

## 2020-08-31 NOTE — Assessment & Plan Note (Signed)
Good response to IUD, in place. Has u/s today.

## 2020-09-04 NOTE — Addendum Note (Signed)
Addended bySamuel Bouche on: 09/04/2020 07:47 AM   Modules accepted: Orders

## 2020-09-05 ENCOUNTER — Telehealth: Payer: Self-pay | Admitting: Obstetrics & Gynecology

## 2020-09-05 NOTE — Telephone Encounter (Signed)
Called patient and discusses [place] and Mirena.  Pt had much better menses this past month.  Some spotting.  Will give a good 3 month trial.  US show a few intramural fibroids, a thin endometrium and a possible focus of adenomyosis at the fundus.  Endometrial biopsy is negative.

## 2020-09-07 ENCOUNTER — Other Ambulatory Visit: Payer: Self-pay

## 2020-09-07 ENCOUNTER — Ambulatory Visit: Payer: No Typology Code available for payment source | Admitting: Medical-Surgical

## 2020-09-07 ENCOUNTER — Ambulatory Visit (INDEPENDENT_AMBULATORY_CARE_PROVIDER_SITE_OTHER): Payer: No Typology Code available for payment source

## 2020-09-07 DIAGNOSIS — G8929 Other chronic pain: Secondary | ICD-10-CM

## 2020-09-07 DIAGNOSIS — M2550 Pain in unspecified joint: Secondary | ICD-10-CM

## 2020-09-11 LAB — CBC WITH DIFFERENTIAL/PLATELET
Absolute Monocytes: 720 cells/uL (ref 200–950)
Basophils Absolute: 50 cells/uL (ref 0–200)
Basophils Relative: 0.7 %
Eosinophils Absolute: 130 cells/uL (ref 15–500)
Eosinophils Relative: 1.8 %
HCT: 41.5 % (ref 35.0–45.0)
Hemoglobin: 13.4 g/dL (ref 11.7–15.5)
Lymphs Abs: 2549 cells/uL (ref 850–3900)
MCH: 29.7 pg (ref 27.0–33.0)
MCHC: 32.3 g/dL (ref 32.0–36.0)
MCV: 92 fL (ref 80.0–100.0)
MPV: 12 fL (ref 7.5–12.5)
Monocytes Relative: 10 %
Neutro Abs: 3751 cells/uL (ref 1500–7800)
Neutrophils Relative %: 52.1 %
Platelets: 314 10*3/uL (ref 140–400)
RBC: 4.51 10*6/uL (ref 3.80–5.10)
RDW: 14.6 % (ref 11.0–15.0)
Total Lymphocyte: 35.4 %
WBC: 7.2 10*3/uL (ref 3.8–10.8)

## 2020-09-11 LAB — ANA,IFA RA DIAG PNL W/RFLX TIT/PATN
Anti Nuclear Antibody (ANA): POSITIVE — AB
Cyclic Citrullin Peptide Ab: <16 UNITS
Rheumatoid fact SerPl-aCnc: <14 IU/mL (ref <14)

## 2020-09-11 LAB — ANTI-NUCLEAR AB-TITER (ANA TITER): ANA Titer 1: 1:160 {titer} — ABNORMAL HIGH

## 2020-09-11 LAB — HIGH SENSITIVITY CRP: hs-CRP: 0.7 mg/L

## 2020-09-11 LAB — SEDIMENTATION RATE: Sed Rate: 6 mm/h (ref 0–20)

## 2020-09-13 NOTE — Addendum Note (Signed)
Addended bySamuel Bouche on: 09/13/2020 09:12 AM   Modules accepted: Orders

## 2020-09-14 ENCOUNTER — Ambulatory Visit: Payer: No Typology Code available for payment source | Admitting: Medical-Surgical

## 2020-09-28 ENCOUNTER — Encounter: Payer: Self-pay | Admitting: Medical-Surgical

## 2020-09-28 ENCOUNTER — Ambulatory Visit (INDEPENDENT_AMBULATORY_CARE_PROVIDER_SITE_OTHER): Payer: No Typology Code available for payment source | Admitting: Medical-Surgical

## 2020-09-28 VITALS — BP 132/83 | HR 102 | Wt 187.0 lb

## 2020-09-28 DIAGNOSIS — Z6831 Body mass index (BMI) 31.0-31.9, adult: Secondary | ICD-10-CM

## 2020-09-28 DIAGNOSIS — E669 Obesity, unspecified: Secondary | ICD-10-CM | POA: Insufficient documentation

## 2020-09-28 DIAGNOSIS — E661 Drug-induced obesity: Secondary | ICD-10-CM | POA: Insufficient documentation

## 2020-09-28 MED ORDER — PHENTERMINE HCL 8 MG PO TABS: ORAL_TABLET | ORAL | 0 refills | Status: DC

## 2020-09-28 NOTE — Progress Notes (Signed)
Subjective:    CC: weight check  HPI: Pleasant 49 year old female presenting for weight check on Phentermine. Admits to cheating a few times on her diet while on vacation. Ran out of phentermine and was without it for approximately 14 days. Had increased from the half tab to the whole tab, tolerated well. Making sure to eat protein in the mornings. Decreased appetite and sometimes doesn't eat when she probably should.  Denies chest pain, shortness of breath, palpitations, lower extremity edema, dizziness, and headaches.  No GI complaints.  Upcoming hysterectomy planned with [facility]. Reports significant post-anesthesia brain fog that last   I reviewed the past medical history, family history, social history, surgical history, and allergies today and no changes were needed.  Please see the problem list section below in epic for further details.  Past Medical History: Past Medical History:  Diagnosis Date  . GERD (gastroesophageal reflux disease)   . History of prolapse of bladder   . Rheumatoid arthritis [facility])    Past Surgical History: Past Surgical History:  Procedure Laterality Date  . CHOLECYSTECTOMY    . DILATION AND CURETTAGE OF UTERUS    . ERCP     Social History: Social History   Socioeconomic History  . Marital status: Married    Spouse name: Not on file  . Number of children: Not on file  . Years of education: Not on file  . Highest education level: Not on file  Occupational History  . Not on file  Tobacco Use  . Smoking status: Former Smoker    Packs/day: 1.00    Types: Cigarettes    Quit date: 04/06/1998    Years since quitting: 22.4  . Smokeless tobacco: Never Used  Substance and Sexual Activity  . Alcohol use: Not Currently    Alcohol/week: 0.0 standard drinks  . Drug use: Never  . Sexual activity: Yes    Partners: Male    Birth control/protection: I.U.D.    Comment: vasectomy  Other Topics Concern  . Not on file  Social History Narrative  . Not on  file   Social Determinants of Health   Financial Resource Strain:   . Difficulty of Paying Living Expenses: Not on file  Food Insecurity:   . Worried About Charity fundraiser in the Last Year: Not on file  . Ran Out of Food in the Last Year: Not on file  Transportation Needs:   . Lack of Transportation (Medical): Not on file  . Lack of Transportation (Non-Medical): Not on file  Physical Activity:   . Days of Exercise per Week: Not on file  . Minutes of Exercise per Session: Not on file  Stress:   . Feeling of Stress : Not on file  Social Connections:   . Frequency of Communication with Friends and Family: Not on file  . Frequency of Social Gatherings with Friends and Family: Not on file  . Attends Religious Services: Not on file  . Active Member of Clubs or Organizations: Not on file  . Attends Archivist Meetings: Not on file  . Marital Status: Not on file   Family History: Family History  Problem Relation Age of Onset  . Diabetes Mother   . Hypertension Mother   . Hypertension Maternal Grandmother   . Alcohol abuse Maternal Grandfather   . Diabetes Paternal Grandmother   . Hypertension Paternal Grandmother   . Cancer Paternal Grandfather   . Hypertension Paternal Grandfather   . Breast cancer Sister 33  Bilateral   Allergies: Allergies  Allergen Reactions  . Amoxicillin     Chest pain and chest tightness  . Terfenadine   . Cephalexin Rash  . Tetracyclines & Related Rash   Medications: See med rec.  Review of Systems: See HPI for pertinent positives and negatives.   Objective:    General: Well Developed, well nourished, and in no acute distress.  Neuro: Alert and oriented x3.  HEENT: Normocephalic, atraumatic.  Skin: Warm and dry. Cardiac: Regular rate and rhythm, no murmurs rubs or gallops, no lower extremity edema.  Respiratory: Clear to auscultation bilaterally. Not using accessory muscles, speaking in full sentences.   Impression and  Recommendations:    1. Class 1 drug-induced obesity without serious comorbidity with body mass index (BMI) of 31.0 to 31.9 in adult 2 pound weight loss despite 2 weeks without the medication.  Continue phentermine 8 mg daily.  Continue lifestyle, dietary, and activity modifications.  Advised patient to discuss phentermine with her OB/GYN as she may need to stop this prior to having her surgery. - Phentermine HCl 8 MG TABS; Take 1 tab (67m) daily.  Dispense: 30 tablet; Refill: 0  Return in about 4 weeks (around 10/26/2020) for weight check. ___________________________________________ JClearnce Sorrel DNP, APRN, FNP-BC [facility]

## 2020-09-28 NOTE — Progress Notes (Signed)
Doing well on current regimen .  

## 2020-10-26 ENCOUNTER — Ambulatory Visit: Payer: No Typology Code available for payment source | Admitting: Medical-Surgical

## 2020-11-05 ENCOUNTER — Encounter: Payer: No Typology Code available for payment source | Admitting: Medical-Surgical

## 2020-11-05 NOTE — Progress Notes (Signed)
Erroneous encounter

## 2021-01-04 ENCOUNTER — Ambulatory Visit: Payer: No Typology Code available for payment source | Admitting: Physician Assistant

## 2021-01-15 DIAGNOSIS — D259 Leiomyoma of uterus, unspecified: Secondary | ICD-10-CM | POA: Insufficient documentation

## 2021-01-15 DIAGNOSIS — N393 Stress incontinence (female) (male): Secondary | ICD-10-CM | POA: Insufficient documentation

## 2021-02-26 ENCOUNTER — Encounter: Payer: Self-pay | Admitting: Medical-Surgical

## 2021-05-06 ENCOUNTER — Other Ambulatory Visit: Payer: Self-pay | Admitting: Medical-Surgical

## 2021-05-06 DIAGNOSIS — K449 Diaphragmatic hernia without obstruction or gangrene: Secondary | ICD-10-CM

## 2021-05-16 ENCOUNTER — Telehealth (INDEPENDENT_AMBULATORY_CARE_PROVIDER_SITE_OTHER): Payer: 59 | Admitting: Medical-Surgical

## 2021-05-16 ENCOUNTER — Encounter: Payer: Self-pay | Admitting: Medical-Surgical

## 2021-05-16 VITALS — BP 120/80 | HR 78 | Temp 98.2°F

## 2021-05-16 DIAGNOSIS — M7989 Other specified soft tissue disorders: Secondary | ICD-10-CM

## 2021-05-16 MED ORDER — HYDROCHLOROTHIAZIDE 12.5 MG PO TABS: 12.5000 mg | ORAL_TABLET | Freq: Every day | ORAL | 1 refills | Status: DC | PRN

## 2021-05-16 NOTE — Progress Notes (Signed)
Virtual Visit via Video Note  I connected with Summer Myers on 05/16/21 at  9:50 AM EDT by a video enabled telemedicine application and verified that I am speaking with the correct person using two identifiers.   I discussed the limitations of evaluation and management by telemedicine and the availability of in person appointments. The patient expressed understanding and agreed to proceed.  Patient location: home Provider locations: office  Subjective:    CC: Foot swelling  HPI: Pleasant 50 year old female presenting via MyChart video visit with complaints of foot swelling.  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  She notes that since she started working from home, she has been having bilateral foot swelling along the top of her legs better in the day.  She does not always have a chance to get up and move around while she is working because they see patients every 15 minutes.  She had this problem before and was prescribed HCTZ 12.5 mg daily as needed which was very helpful.  She has not been wearing her compression stockings at home but she does have some that she can use.  Drinking plenty of water and not eating foods excessively high in sodium.  She has had a few elevated blood pressures in the past but these were during nurse practitioner school.  She does monitor her blood pressure at home and notes most of her readings are in the 120s/70s.  Past medical history, Surgical history, Family history not pertinant except as noted below, Social history, Allergies, and medications have been entered into the medical record, reviewed, and corrections made.   Review of Systems: See HPI for pertinent positives and negatives.   Objective:    General: Speaking clearly in complete sentences without any shortness of breath.  Alert and oriented x3.  Normal judgment. No apparent acute distress.  Impression and Recommendations:    1. Foot swelling Restart HCTZ  12.5 mg daily as needed.  Monitor blood pressure closely and hold dose if less than 110/60.  Continue staying well-hydrated and avoid sodium when possible.  Recommend wearing compression stockings even though she is working from home.  I discussed the assessment and treatment plan with the patient. The patient was provided an opportunity to ask questions and all were answered. The patient agreed with the plan and demonstrated an understanding of the instructions.   The patient was advised to call back or seek an in-person evaluation if the symptoms worsen or if the condition fails to improve as anticipated.  20 minutes of non-face-to-face time was provided during this encounter.  Return if symptoms worsen or fail to improve.  Clearnce Sorrel, DNP, APRN, FNP-BC [facility]

## 2021-05-23 ENCOUNTER — Other Ambulatory Visit: Payer: Self-pay | Admitting: Medical-Surgical

## 2021-06-11 ENCOUNTER — Other Ambulatory Visit: Payer: Self-pay | Admitting: Medical-Surgical

## 2021-06-11 DIAGNOSIS — M2550 Pain in unspecified joint: Secondary | ICD-10-CM

## 2021-06-27 ENCOUNTER — Other Ambulatory Visit: Payer: Self-pay | Admitting: Medical-Surgical

## 2021-06-27 DIAGNOSIS — K449 Diaphragmatic hernia without obstruction or gangrene: Secondary | ICD-10-CM

## 2021-06-27 MED ORDER — OMEPRAZOLE 40 MG PO CPDR: 40.0000 mg | DELAYED_RELEASE_CAPSULE | Freq: Every day | ORAL | 0 refills | Status: DC

## 2021-08-06 ENCOUNTER — Other Ambulatory Visit: Payer: Self-pay | Admitting: Physician Assistant

## 2021-08-06 DIAGNOSIS — K449 Diaphragmatic hernia without obstruction or gangrene: Secondary | ICD-10-CM

## 2021-08-06 NOTE — Telephone Encounter (Signed)
Joy patient.

## 2021-09-03 ENCOUNTER — Encounter: Payer: Self-pay | Admitting: Medical-Surgical

## 2021-09-03 MED ORDER — ACYCLOVIR 400 MG PO TABS: ORAL_TABLET | ORAL | 0 refills | Status: DC

## 2021-09-03 NOTE — Telephone Encounter (Signed)
Pt has not had a visit with our practice since 09/2020 other than an acute visit for foot swelling 04/2021.  She also has not had labs within the last year.  Please review refill request and approve if appropriate.  Charyl Bigger, CMA

## 2021-10-17 LAB — HM MAMMOGRAPHY

## 2021-10-18 ENCOUNTER — Encounter: Payer: Self-pay | Admitting: Medical-Surgical

## 2021-12-15 DIAGNOSIS — Z90710 Acquired absence of both cervix and uterus: Secondary | ICD-10-CM | POA: Insufficient documentation

## 2022-01-27 ENCOUNTER — Telehealth: Payer: Self-pay | Admitting: General Practice

## 2022-01-27 NOTE — Telephone Encounter (Signed)
Transition Care Management Unsuccessful Follow-up Telephone Call  Date of discharge and from where:  01/24/22 from [facility]  Attempts:  1st Attempt  Reason for unsuccessful TCM follow-up call:  Left voice message

## 2022-01-29 NOTE — Telephone Encounter (Signed)
Transition Care Management Unsuccessful Follow-up Telephone Call  Date of discharge and from where:  01/24/22 from [facility]  Attempts:  2nd Attempt  Reason for unsuccessful TCM follow-up call:  Left voice message

## 2022-01-31 NOTE — Telephone Encounter (Signed)
Transition Care Management Unsuccessful Follow-up Telephone Call  Date of discharge and from where:  01/24/22 from [facility]  Attempts:  3rd Attempt  Reason for unsuccessful TCM follow-up call:  Left voice message

## 2022-05-22 IMAGING — US US PELVIS COMPLETE
1 series · 13 of 25 positions shown · non-contrast
Comparison: None

CLINICAL DATA: Dysfunctional uterine bleeding



[Series 1: us pelvis complete · 0.25mm/px · 13 of 153 slices shown]
[im 1/153]
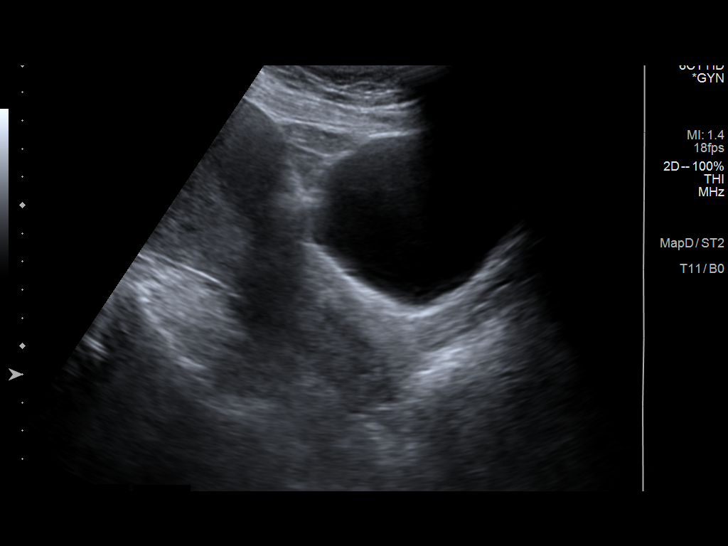
[im 13/153]
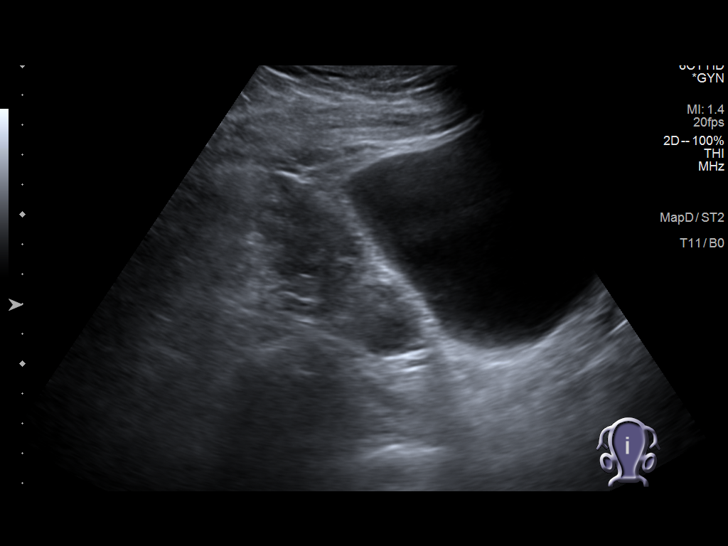
[im 26/153]
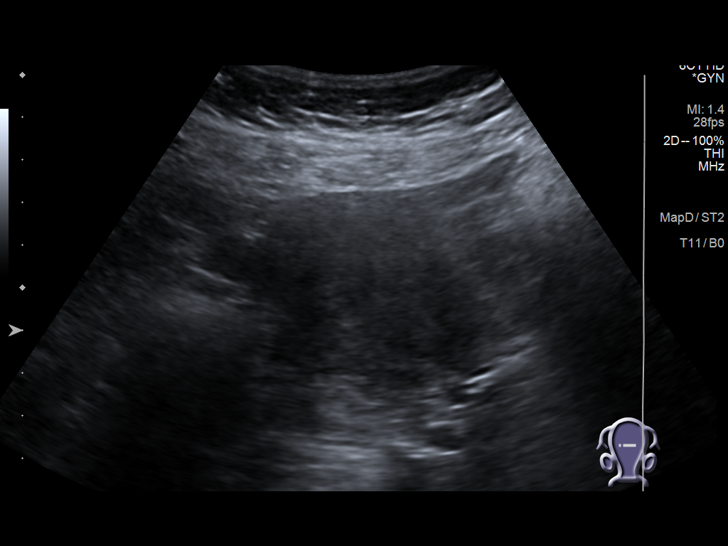
[im 39/153]
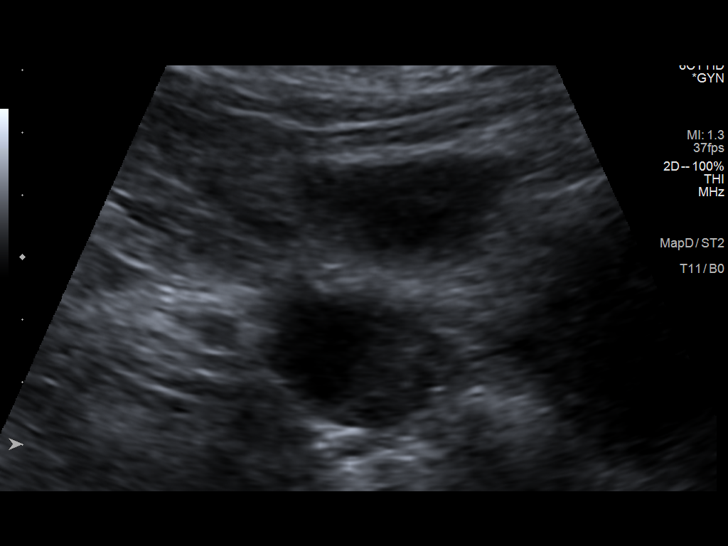
[im 51/153]
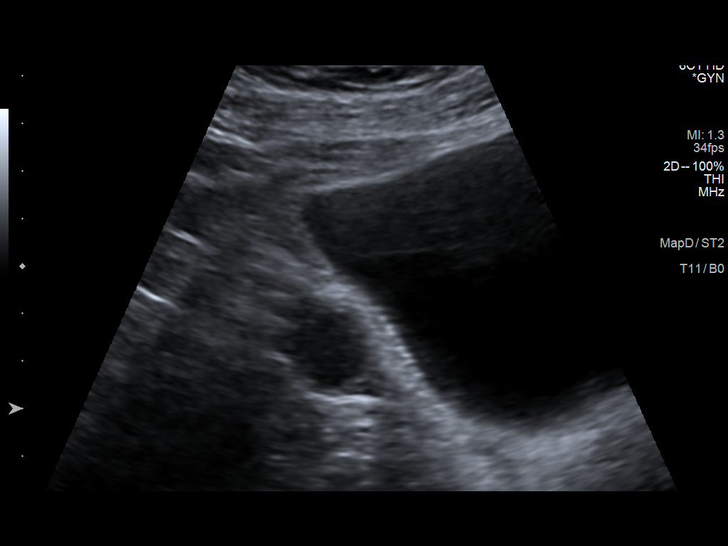
[im 64/153]
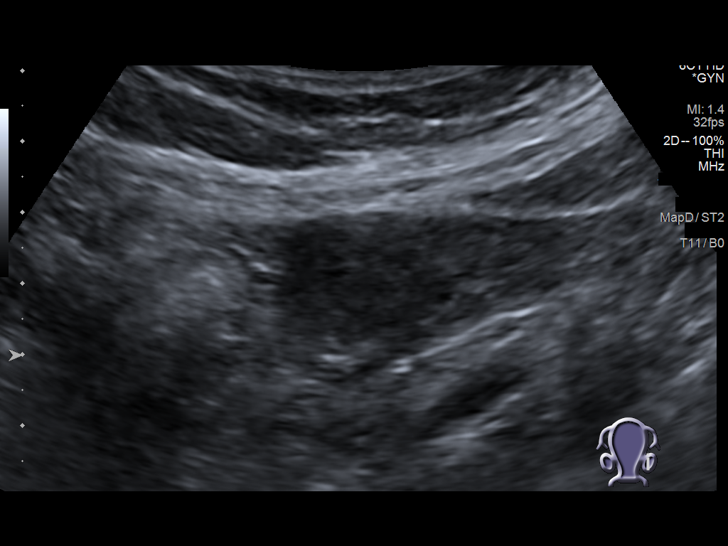
[im 77/153]
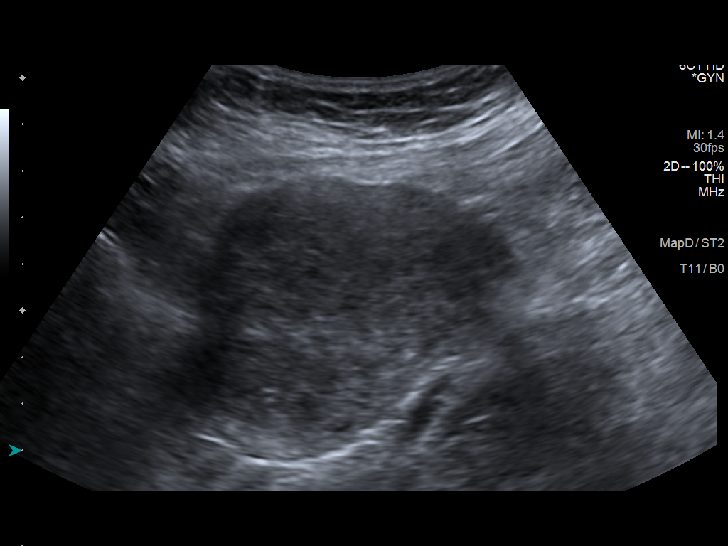
[im 89/153]
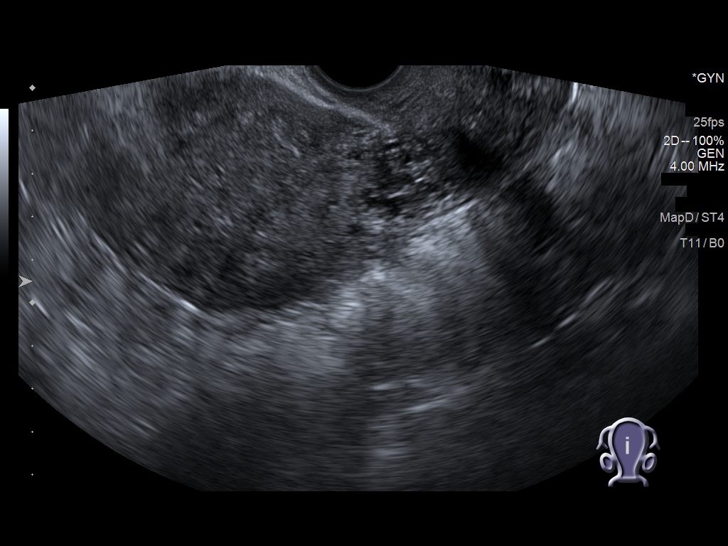
[im 102/153]
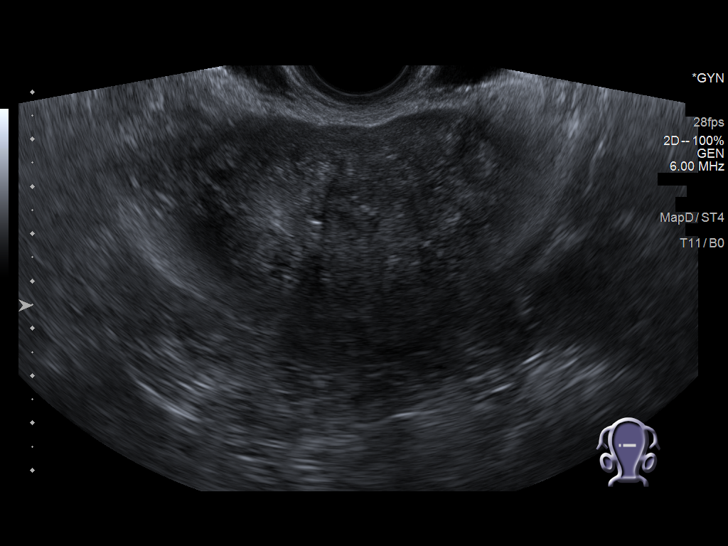
[im 115/153]
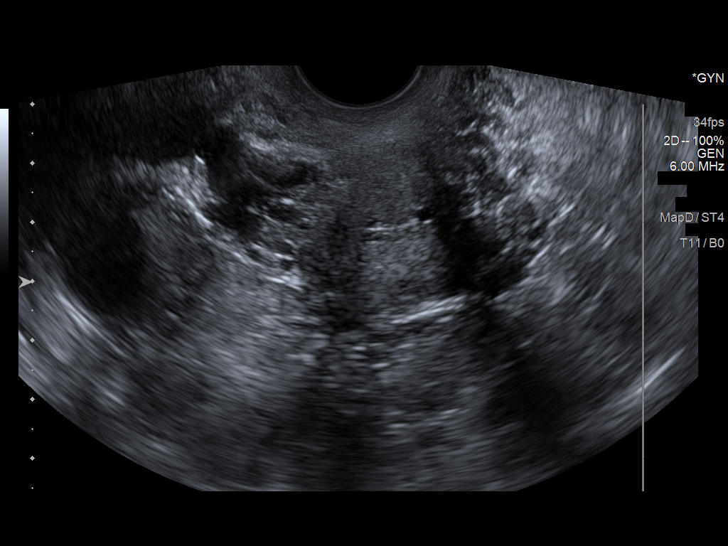
[im 127/153]
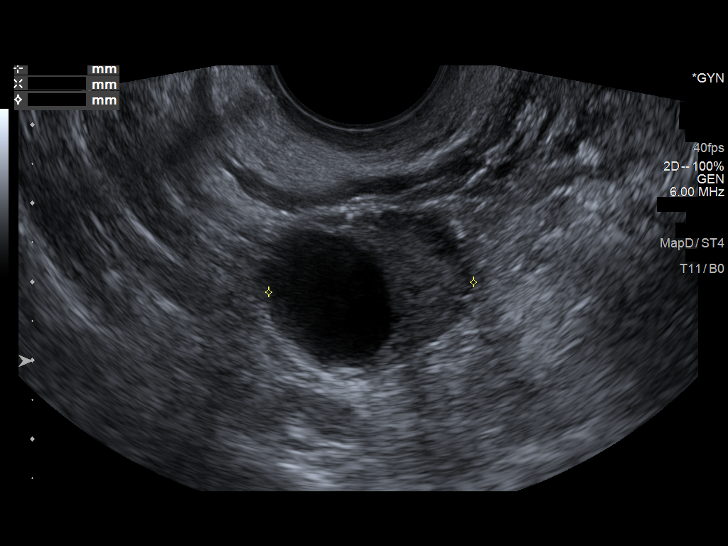
[im 140/153]
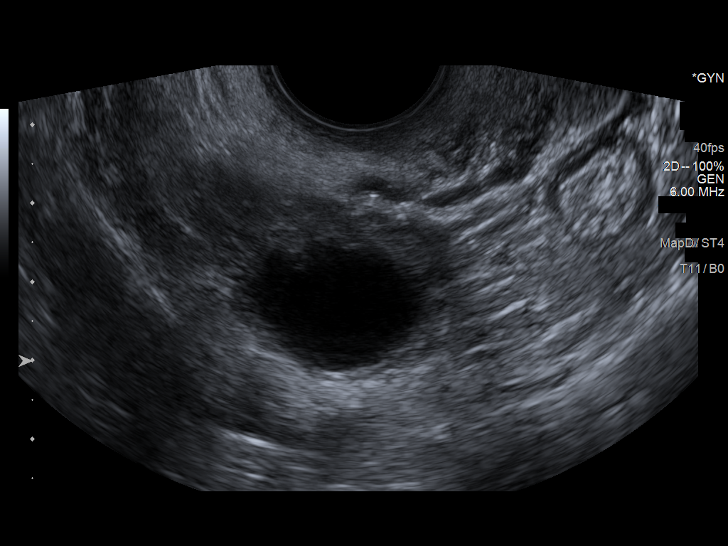
[im 153/153]
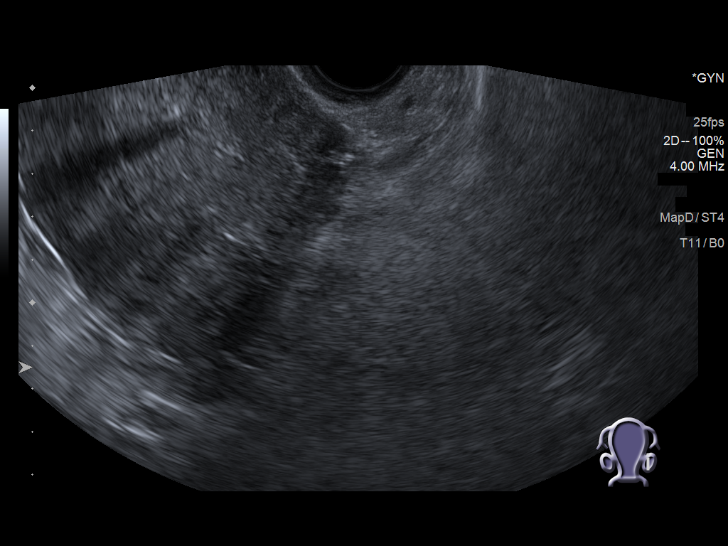

[13 of 25 positions shown; findings below may reference images not displayed]

FINDINGS: Uterus

Measurements: 14.2 x 7.3 x 9.3 cm = volume: 509 mL. The uterine
fundus is enlarged and the myometrial echotexture is coarsened.
There are at least 3 heterogeneously hypoechoic solid masses
identified within the posterior fundus in keeping with multiple
uterine fibroids within an intramural location. The cervix is
unremarkable.

Endometrium

Thickness: Roughly 6 mm. Myometrial/endometrial demarcation is
relatively poor within the uterine fundus suggesting superimposed
adenomyosis. An intrauterine device in expected position within the
endometrial cavity.

Right ovary

Measurements: 3.3 x 2.3 x 2.6 cm = volume: 10 mL. Normal
appearance/no adnexal mass. A dominant follicle seen within the
right ovary

Left ovary

Measurements: 3.4 x 1.8 x 1.9 cm = volume: 6 mL. Normal
appearance/no adnexal mass.

Other findings

No abnormal free fluid.
IMPRESSION: 1. Fibroid uterus with relatively poor definition of the
myometrial/endometrial demarcation suggesting superimposed
adenomyosis.
2. Unremarkable sonographic appearance of the ovaries.
3. Intrauterine device in expected position within the endometrial
cavity.

## 2022-05-29 IMAGING — DX DG HAND 2V*R*
1 series · 1 of 1 positions shown · non-contrast
Comparison: None.

CLINICAL DATA: Bilateral hand pain, possible rheumatoid arthritis

EXAM:
LEFT HAND - 2 VIEW; RIGHT HAND - 2 VIEW

[hand pa]
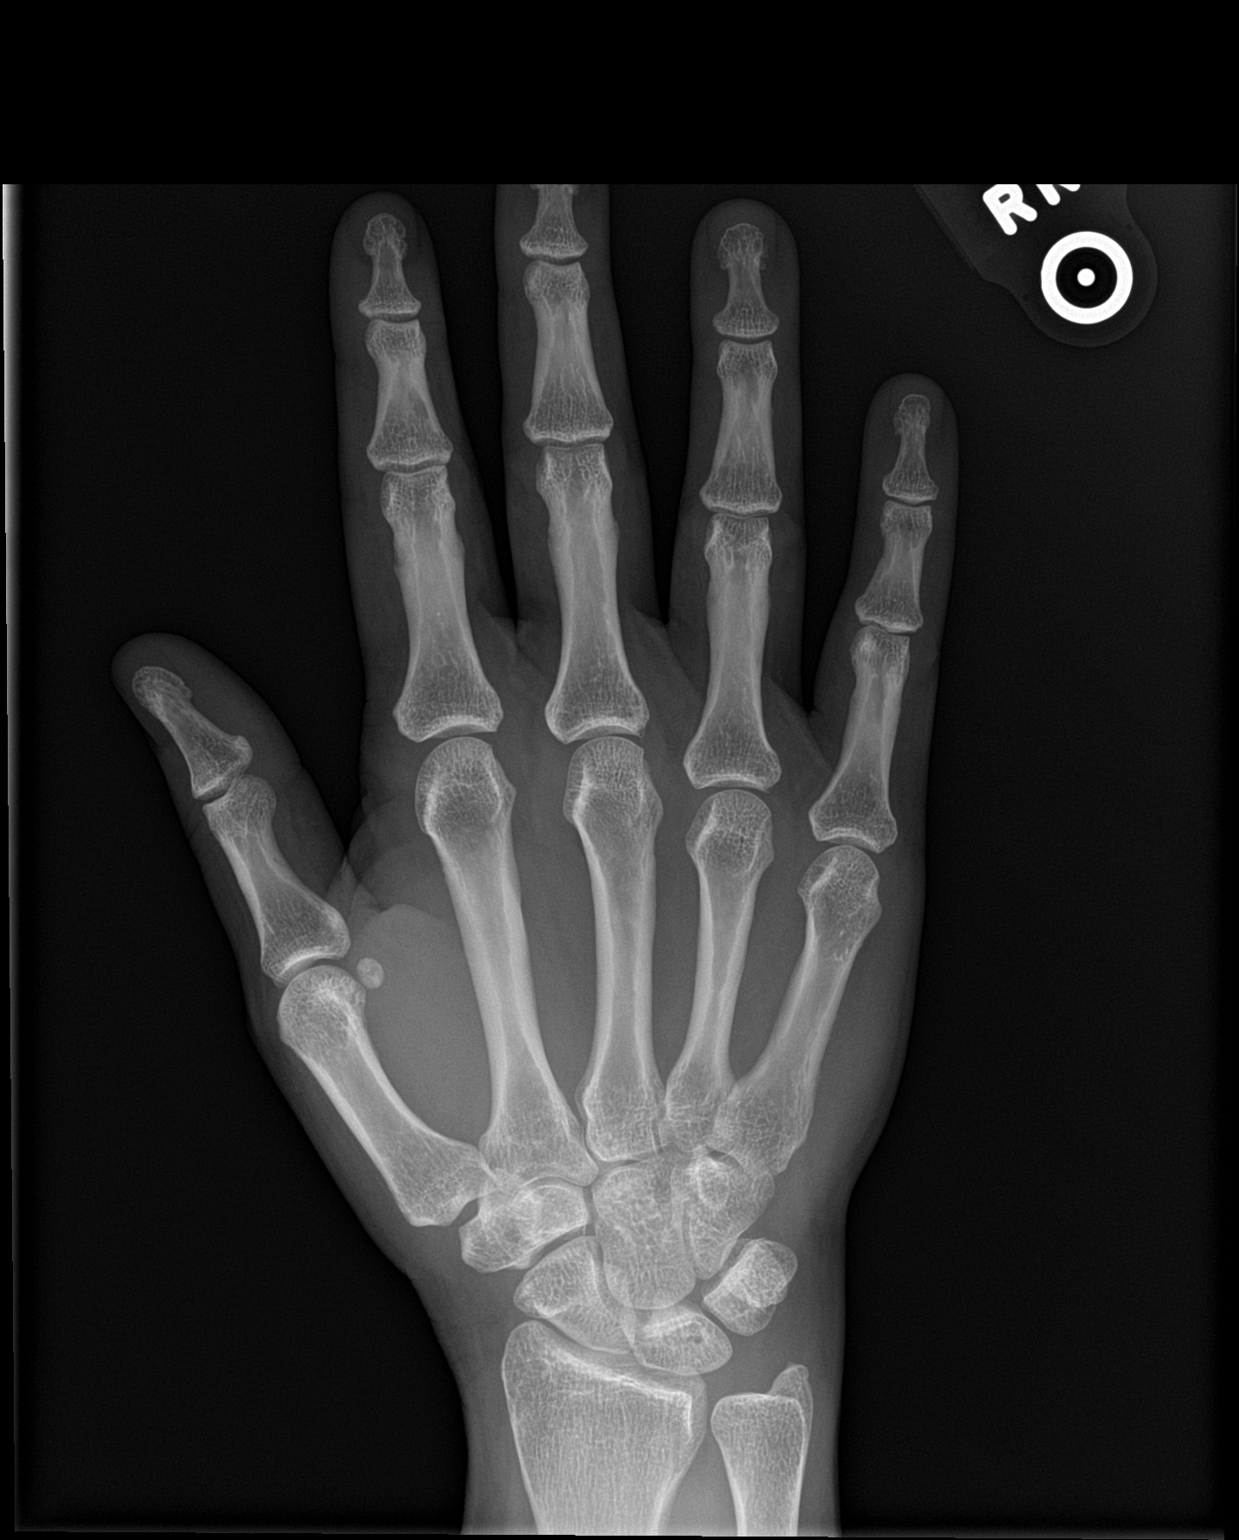

[1 of 1 positions shown; findings below may reference images not displayed]

FINDINGS: Right Hand: Frontal and failed lateral views demonstrate no acute
fracture. Alignment is anatomic. There is mild joint space narrowing
throughout the distal interphalangeal joints. No erosive changes.
Soft tissues are normal.

Left hand: Frontal and failed lateral views demonstrate no acute
fracture. Alignment is anatomic. Joint spaces are well preserved. No
erosive changes. Soft tissues are normal.
IMPRESSION: 1. Mild osteoarthritis within the right hand.
2. Unremarkable left hand.
3. No evidence of inflammatory or erosive arthropathy.

## 2022-06-09 ENCOUNTER — Encounter: Payer: Self-pay | Admitting: Medical-Surgical

## 2023-03-02 ENCOUNTER — Ambulatory Visit: Payer: 59 | Admitting: Family Medicine

## 2023-03-09 ENCOUNTER — Other Ambulatory Visit: Payer: Self-pay | Admitting: Medical-Surgical

## 2023-03-09 DIAGNOSIS — K449 Diaphragmatic hernia without obstruction or gangrene: Secondary | ICD-10-CM

## 2023-03-26 ENCOUNTER — Ambulatory Visit: Payer: 59 | Admitting: Family Medicine

## 2023-03-30 ENCOUNTER — Ambulatory Visit (INDEPENDENT_AMBULATORY_CARE_PROVIDER_SITE_OTHER): Payer: No Typology Code available for payment source | Admitting: Family Medicine

## 2023-03-30 ENCOUNTER — Encounter: Payer: Self-pay | Admitting: Family Medicine

## 2023-03-30 VITALS — BP 136/82 | HR 82 | Temp 98.8°F | Ht 65.0 in | Wt 191.1 lb

## 2023-03-30 DIAGNOSIS — Z7689 Persons encountering health services in other specified circumstances: Secondary | ICD-10-CM

## 2023-03-30 DIAGNOSIS — L4050 Arthropathic psoriasis, unspecified: Secondary | ICD-10-CM

## 2023-03-30 DIAGNOSIS — N390 Urinary tract infection, site not specified: Secondary | ICD-10-CM

## 2023-03-30 DIAGNOSIS — E661 Drug-induced obesity: Secondary | ICD-10-CM

## 2023-03-30 DIAGNOSIS — K449 Diaphragmatic hernia without obstruction or gangrene: Secondary | ICD-10-CM | POA: Diagnosis not present

## 2023-03-30 DIAGNOSIS — K2100 Gastro-esophageal reflux disease with esophagitis, without bleeding: Secondary | ICD-10-CM

## 2023-03-30 NOTE — Assessment & Plan Note (Addendum)
Annual physical due in November. She will get fasitng labs at that time. Recent labs per rheumatology. Labs reviewed.

## 2023-03-30 NOTE — Assessment & Plan Note (Signed)
Establish care with [facility] rheumatology, Dr. Gavin Pound.  Medication managed per Dr. Lenna Gilford.  She is currently well-controlled.

## 2023-03-30 NOTE — Assessment & Plan Note (Signed)
Care through [facility] for weight loss. Taking tirepatide 5 mg SQ weekly.  She reports 25-30 pound weight loss. Tolerating medication well.

## 2023-03-30 NOTE — Assessment & Plan Note (Signed)
Taking omeprazole 40 mg as prescribed.  Symptoms well-controlled.  No refills needed today

## 2023-03-30 NOTE — Progress Notes (Signed)
New Patient Office Visit  Subjective    Patient ID: Summer Myers, female    DOB: Feb 25, 1971  Age: 52 y.o. MRN: [id number]  CC:  Chief Complaint  Patient presents with   Establish Care    Pt states that she is not transferring from [place]. She states that she established care with Dr. Octavio Graves in [facility]. No concerns. Pt is not fasting.     HPI Summer Myers presents to establish care with this practice. She is new to me. No health concerns to discuss today. 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 Her 75 year old daughter is present during this visit.    Psoriatic arthritis:  has established care with rheumatoogy in [place]. Summer Pound, MD who manages her medications. She is well controlled currently.    Chronic UTI: sees [facility], had hysterectomy and bladder. Having chronic UTI on preventative macrodantin 50 mg.    Weight loss:   sees  [place] for weight loss. Taking generic tirzepatide 5 mg SQ weekly. Has lost 25-30 pounds. Exercises: cardio, weight lifting 4 days per week.   Hiatal hernia with esophagitis: On omeprazole 40 mg QD   Chart review: 03/19/23: [facility]. Labs reviewed.  12/03/22: mammogram screening visit.  History of hysterectomy: no pap smears required.  Cologuard: 06/22/20- normal.  History reviewed. Medications reviewed.  Health maintenance reviewed.      Outpatient Encounter Medications as of 03/30/2023  Medication Sig   celecoxib (CELEBREX) 200 MG capsule Take 200 mg by mouth.   Cholecalciferol 50 MCG (2000 UT) CAPS 1 capsule Orally Once a day   clobetasol (TEMOVATE) 0.05 % external solution Apply to scalp TWICE DAILY x 2 weeks AS NEEDED   diclofenac Sodium (VOLTAREN) 1 % GEL Apply topically.   estradiol (ESTRACE) 0.1 MG/GM vaginal cream Insert 1 g twice a week by vaginal route in the evening.   estradiol (ESTRACE) 0.5 MG tablet Take 1 tablet by mouth daily.   famotidine (PEPCID) 20 MG  tablet Take 20 mg by mouth.   ibuprofen (ADVIL) 800 MG tablet Take 800 mg by mouth daily as needed.   Multiple Vitamin (MULTI VITAMIN) TABS 1 tablet Orally Once a day   nitrofurantoin (MACRODANTIN) 50 MG capsule Take 50 mg by mouth.   omeprazole (PRILOSEC) 40 MG capsule Take 1 capsule by mouth once daily   predniSONE (DELTASONE) 5 MG tablet 5 mg.   Probiotic TBEC 1 tablet Orally daily   Semaglutide,0.25 or 0.5MG /DOS, 2 MG/3ML SOPN 0.5 mg Subcutaneous Once weekly   TREMFYA 100 MG/ML SOSY    valACYclovir (VALTREX) 1000 MG tablet Take 2 tablets by mouth 2 (two) times daily.   [DISCONTINUED] acyclovir (ZOVIRAX) 400 MG tablet TAKE TWO TABLETS BY MOUTH THREE TIMES DAILY FOR 2-5 DAYS WITH COLD SORE RECURRENCE (Patient not taking: Reported on 03/30/2023)   [DISCONTINUED] hydrochlorothiazide (HYDRODIURIL) 12.5 MG tablet Take 1 tablet (12.5 mg total) by mouth daily as needed. (Patient not taking: Reported on 03/30/2023)   No facility-administered encounter medications on file as of 03/30/2023.    Past Medical History:  Diagnosis Date   Hiatal hernia with GERD and esophagitis    History of prolapse of bladder    Migraines    Psoriatic arthritis     Past Surgical History:  Procedure Laterality Date   CHOLECYSTECTOMY     DILATION AND CURETTAGE OF UTERUS     ERCP     TOTAL ABDOMINAL HYSTERECTOMY  WRIST SURGERY      Family History  Problem Relation Age of Onset   Diabetes Mother    Hypertension Mother    Hypertension Maternal Grandmother    Alcohol abuse Maternal Grandfather    Diabetes Paternal Grandmother    Hypertension Paternal Grandmother    Cancer Paternal Grandfather    Hypertension Paternal Grandfather    Breast cancer Sister 54       Bilateral    Social History   Socioeconomic History   Marital status: Married    Spouse name: Not on file   Number of children: Not on file   Years of education: Not on file   Highest education level: Not on file  Occupational History   Not  on file  Tobacco Use   Smoking status: Former    Packs/day: 1    Types: Cigarettes    Quit date: 04/06/1998    Years since quitting: 24.9   Smokeless tobacco: Never  Substance and Sexual Activity   Alcohol use: Not Currently    Alcohol/week: 0.0 standard drinks of alcohol   Drug use: Never   Sexual activity: Yes    Partners: Male    Birth control/protection: I.U.D.    Comment: vasectomy  Other Topics Concern   Not on file  Social History Narrative   Not on file   Social Determinants of Health   Financial Resource Strain: Not on file  Food Insecurity: Not on file  Transportation Needs: Not on file  Physical Activity: Not on file  Stress: Not on file  Social Connections: Not on file  Intimate Partner Violence: Not At Risk (04/06/2020)   Humiliation, Afraid, Rape, and Kick questionnaire    Fear of Current or Ex-Partner: No    Emotionally Abused: No    Physically Abused: No    Sexually Abused: No    Review of Systems  Constitutional:  Negative for chills and fever.  Eyes:  Negative for blurred vision and double vision.  Respiratory:  Negative for shortness of breath.   Cardiovascular:  Negative for chest pain.  Gastrointestinal:  Negative for abdominal pain, nausea and vomiting.  Neurological:  Positive for headaches (histroy of migaine). Negative for dizziness.  Psychiatric/Behavioral:  Negative for depression and suicidal ideas.         Objective    BP 136/82   Pulse 82   Temp 98.8 F (37.1 C) (Oral)   Ht 5\' 5"  (1.651 m)   Wt 191 lb 1.6 oz (86.7 kg)   LMP 08/24/2020   SpO2 97%   BMI 31.80 kg/m   Physical Exam Vitals and nursing note reviewed.  Constitutional:      General: She is not in acute distress.    Appearance: Normal appearance.  Cardiovascular:     Rate and Rhythm: Normal rate and regular rhythm.     Heart sounds: Normal heart sounds.  Pulmonary:     Effort: Pulmonary effort is normal.     Breath sounds: Normal breath sounds.  Skin:     General: Skin is warm and dry.     Capillary Refill: Capillary refill takes less than 2 seconds.  Neurological:     General: No focal deficit present.     Mental Status: She is alert. Mental status is at baseline.  Psychiatric:        Mood and Affect: Mood normal.        Behavior: Behavior normal.        Thought Content: Thought content normal.  Judgment: Judgment normal.        Assessment & Plan:   Problem List Items Addressed This Visit     Hiatal hernia with gastroesophageal reflux disease and esophagitis    Taking omeprazole 40 mg as prescribed.  Symptoms well-controlled.  No refills needed today      Relevant Medications   famotidine (PEPCID) 20 MG tablet   Probiotic TBEC   Class 1 drug-induced obesity without serious comorbidity with body mass index (BMI) of 31.0 to 31.9 in adult    Care through [facility] for weight loss. Taking tirzepatide 5 mg SQ weekly.  She reports 25-30 Myers weight loss. Tolerating medication well. Medications managed per [facility].       Relevant Medications   Tirzepatide 5 mg SQ weekly.    Psoriatic arthritis    Establish care with [facility] rheumatology, Dr. Gavin Myers.  Medication managed per Dr. Lenna Gilford.  She is currently well-controlled.      Relevant Medications   celecoxib (CELEBREX) 200 MG capsule   predniSONE (DELTASONE) 5 MG tablet   Establishing care with new doctor, encounter for - Primary    Annual physical due in November. She will get fasitng labs at that time. Recent labs per rheumatology.       Chronic UTI (urinary tract infection)    On Macrodantin 50 mg daily, managed by [facility].  Reports symptoms have been present since her hysterectomy and bladder tack.  Medications managed by GYN.      Relevant Medications   valACYclovir (VALTREX) 1000 MG tablet   nitrofurantoin (MACRODANTIN) 50 MG capsule  Agrees with plan of care discussed.  Questions answered.   Return in about 34 weeks (around 11/23/2023)  for cpe with labs .   Chalmers Guest, FNP

## 2023-03-30 NOTE — Assessment & Plan Note (Signed)
On Macrodantin 50 mg daily, managed by [facility].  Reports symptoms have been present since her hysterectomy and bladder tack.  Medications managed by GYN.

## 2023-04-13 ENCOUNTER — Encounter: Payer: Self-pay | Admitting: Family Medicine

## 2023-04-13 ENCOUNTER — Other Ambulatory Visit: Payer: Self-pay | Admitting: Family Medicine

## 2023-04-13 ENCOUNTER — Telehealth: Payer: Self-pay | Admitting: Family Medicine

## 2023-04-13 DIAGNOSIS — K449 Diaphragmatic hernia without obstruction or gangrene: Secondary | ICD-10-CM

## 2023-04-13 MED ORDER — OMEPRAZOLE 40 MG PO CPDR: 40.0000 mg | DELAYED_RELEASE_CAPSULE | Freq: Every day | ORAL | 0 refills | Status: DC

## 2023-04-13 NOTE — Telephone Encounter (Signed)
Patient is calling to state that she received refills on estradiol and nitrophranagan (from [facility] and [place]) and omeprazol 40 at the beginning of the month but thinks she threw these medications away on accident while shopping. Patient is calling to request a temporary (30 day refill) on these medications until the next refill is due. Please advise. Summer Myers

## 2023-05-08 ENCOUNTER — Other Ambulatory Visit: Payer: Self-pay | Admitting: Family Medicine

## 2023-05-08 DIAGNOSIS — K449 Diaphragmatic hernia without obstruction or gangrene: Secondary | ICD-10-CM

## 2023-08-13 ENCOUNTER — Other Ambulatory Visit: Payer: Self-pay | Admitting: Family Medicine

## 2023-08-13 DIAGNOSIS — K449 Diaphragmatic hernia without obstruction or gangrene: Secondary | ICD-10-CM

## 2023-11-17 ENCOUNTER — Other Ambulatory Visit: Payer: Self-pay | Admitting: Family Medicine

## 2023-11-17 DIAGNOSIS — K449 Diaphragmatic hernia without obstruction or gangrene: Secondary | ICD-10-CM

## 2023-11-24 ENCOUNTER — Encounter: Payer: No Typology Code available for payment source | Admitting: Family Medicine

## 2024-02-12 ENCOUNTER — Other Ambulatory Visit: Payer: Self-pay | Admitting: Family Medicine

## 2024-02-12 DIAGNOSIS — K449 Diaphragmatic hernia without obstruction or gangrene: Secondary | ICD-10-CM

## 2024-05-03 ENCOUNTER — Other Ambulatory Visit: Payer: Self-pay | Admitting: Family Medicine

## 2024-05-03 DIAGNOSIS — K449 Diaphragmatic hernia without obstruction or gangrene: Secondary | ICD-10-CM
